# Patient Record
Sex: Male | Born: 1955 | Race: White | Hispanic: No | Marital: Single | Smoking: Never smoker
Health system: Southern US, Community
[De-identification: ages and names within clinical notes are randomized; demographics above are authoritative.]

## PROBLEM LIST (undated history)

## (undated) DIAGNOSIS — F909 Attention-deficit hyperactivity disorder, unspecified type: Secondary | ICD-10-CM

## (undated) DIAGNOSIS — K4020 Bilateral inguinal hernia, without obstruction or gangrene, not specified as recurrent: Secondary | ICD-10-CM

## (undated) DIAGNOSIS — F419 Anxiety disorder, unspecified: Secondary | ICD-10-CM

## (undated) DIAGNOSIS — M1990 Unspecified osteoarthritis, unspecified site: Secondary | ICD-10-CM

## (undated) DIAGNOSIS — M109 Gout, unspecified: Secondary | ICD-10-CM

## (undated) DIAGNOSIS — Z973 Presence of spectacles and contact lenses: Secondary | ICD-10-CM

## (undated) DIAGNOSIS — K219 Gastro-esophageal reflux disease without esophagitis: Secondary | ICD-10-CM

## (undated) DIAGNOSIS — J986 Disorders of diaphragm: Secondary | ICD-10-CM

## (undated) HISTORY — PX: COLONOSCOPY: SHX174

## (undated) HISTORY — PX: TONSILLECTOMY: SUR1361

## (undated) HISTORY — PX: BILATERAL CARPAL TUNNEL RELEASE: SHX6508

## (undated) HISTORY — PX: HERNIA REPAIR: SHX51

## (undated) HISTORY — PX: MULTIPLE TOOTH EXTRACTIONS: SHX2053

## (undated) HISTORY — PX: JOINT REPLACEMENT: SHX530

## (undated) HISTORY — PX: LEG SURGERY: SHX1003

---

## 2015-12-26 ENCOUNTER — Inpatient Hospital Stay (HOSPITAL_COMMUNITY): Admission: RE | Admit: 2015-12-26 | Payer: Self-pay | Source: Ambulatory Visit

## 2015-12-26 NOTE — Pre-Procedure Instructions (Signed)
    Chad Roth  12/26/2015      [id number] [place] [place] [address] [place] [address] Phone: [phone] Fax: [phone]    Your procedure is scheduled on Wednesday, Jan 04, 2016  Report to [facility] Admitting at 10:30 A.M.  Call this number if you have problems the morning of surgery:  [phone]   Remember:  Do not eat food or drink liquids after midnight Tuesday, Jan 03, 2016  Take these medicines the morning of surgery with A SIP OF WATER :allopurinol (ZYLOPRIM),   omeprazole (PRILOSEC) Stop taking Aspirin, vitamins, fish oil, and herbal medications. Do not take any NSAIDs ie: Ibuprofen, Advil, Naproxen, BC and Goody Powder or any medication containing Aspirin;stop now.  Do not wear jewelry, make-up or nail polish.  Do not wear lotions, powders, or perfumes.  You may not wear deodorant.  Do not shave 48 hours prior to surgery.  Men may shave face and neck.  Do not bring valuables to the hospital.  [facility] is not responsible for any belongings or valuables.  Contacts, dentures or bridgework may not be worn into surgery.  Leave your suitcase in the car.  After surgery it may be brought to your room.  For patients admitted to the hospital, discharge time will be determined by your treatment team.  Patients discharged the day of surgery will not be allowed to drive home.   Name and phone number of your driver:   Special instructions: Shower the night before surgery and the morning of surgery with CHG.  Please read over the following fact sheets that you were given. Pain Booklet, Coughing and Deep Breathing, Total Joint Packet, MRSA Information and Surgical Site Infection Prevention

## 2015-12-27 ENCOUNTER — Other Ambulatory Visit (HOSPITAL_COMMUNITY): Payer: Self-pay

## 2015-12-29 ENCOUNTER — Encounter (HOSPITAL_COMMUNITY): Payer: Self-pay

## 2015-12-29 ENCOUNTER — Ambulatory Visit (HOSPITAL_COMMUNITY)
Admission: RE | Admit: 2015-12-29 | Discharge: 2015-12-29 | Disposition: A | Discharge status: Home / Self Care | Payer: Medicaid Other | Source: Ambulatory Visit | Attending: Surgery | Admitting: Surgery

## 2015-12-29 ENCOUNTER — Encounter (HOSPITAL_COMMUNITY)
Admission: RE | Admit: 2015-12-29 | Discharge: 2015-12-29 | Disposition: A | Discharge status: Home / Self Care | Payer: Medicaid Other | Source: Ambulatory Visit | Attending: Orthopaedic Surgery | Admitting: Orthopaedic Surgery

## 2015-12-29 DIAGNOSIS — Z01812 Encounter for preprocedural laboratory examination: Secondary | ICD-10-CM | POA: Diagnosis not present

## 2015-12-29 DIAGNOSIS — Z01818 Encounter for other preprocedural examination: Secondary | ICD-10-CM | POA: Insufficient documentation

## 2015-12-29 HISTORY — DX: Unspecified osteoarthritis, unspecified site: M19.90

## 2015-12-29 HISTORY — DX: Gastro-esophageal reflux disease without esophagitis: K21.9

## 2015-12-29 LAB — CBC
HCT: 47.3 % (ref 39.0–52.0)
Hemoglobin: 15.7 g/dL (ref 13.0–17.0)
MCH: 30.6 pg (ref 26.0–34.0)
MCHC: 33.2 g/dL (ref 30.0–36.0)
MCV: 92.2 fL (ref 78.0–100.0)
PLATELETS: 263 10*3/uL (ref 150–400)
RBC: 5.13 MIL/uL (ref 4.22–5.81)
RDW: 13 % (ref 11.5–15.5)
WBC: 5.1 10*3/uL (ref 4.0–10.5)

## 2015-12-29 LAB — COMPREHENSIVE METABOLIC PANEL
ALT: 18 U/L (ref 17–63)
ANION GAP: 9 (ref 5–15)
AST: 24 U/L (ref 15–41)
Albumin: 3.9 g/dL (ref 3.5–5.0)
Alkaline Phosphatase: 94 U/L (ref 38–126)
BUN: 14 mg/dL (ref 6–20)
CHLORIDE: 104 mmol/L (ref 101–111)
CO2: 28 mmol/L (ref 22–32)
Calcium: 9.4 mg/dL (ref 8.9–10.3)
Creatinine, Ser: 1.13 mg/dL (ref 0.61–1.24)
Glucose, Bld: 85 mg/dL (ref 65–99)
POTASSIUM: 4.3 mmol/L (ref 3.5–5.1)
Sodium: 141 mmol/L (ref 135–145)
TOTAL PROTEIN: 6.7 g/dL (ref 6.5–8.1)
Total Bilirubin: 0.8 mg/dL (ref 0.3–1.2)

## 2015-12-29 LAB — URINALYSIS, ROUTINE W REFLEX MICROSCOPIC
BILIRUBIN URINE: NEGATIVE
Glucose, UA: NEGATIVE mg/dL
Hgb urine dipstick: NEGATIVE
KETONES UR: NEGATIVE mg/dL
LEUKOCYTES UA: NEGATIVE
NITRITE: NEGATIVE
PROTEIN: NEGATIVE mg/dL
Specific Gravity, Urine: 1.021 (ref 1.005–1.030)
pH: 6 (ref 5.0–8.0)

## 2015-12-29 LAB — SURGICAL PCR SCREEN
MRSA, PCR: NEGATIVE
STAPHYLOCOCCUS AUREUS: NEGATIVE

## 2015-12-29 LAB — APTT: aPTT: 32 seconds (ref 24–37)

## 2015-12-29 LAB — PROTIME-INR
INR: 1.04 (ref 0.00–1.49)
PROTHROMBIN TIME: 13.8 s (ref 11.6–15.2)

## 2016-01-04 ENCOUNTER — Inpatient Hospital Stay (HOSPITAL_COMMUNITY)
Admission: RE | Admit: 2016-01-04 | Discharge: 2016-01-07 | DRG: 470 | Disposition: A | Discharge status: Home / Self Care | Payer: Medicaid Other | Source: Ambulatory Visit | Attending: Orthopaedic Surgery | Admitting: Orthopaedic Surgery

## 2016-01-04 ENCOUNTER — Inpatient Hospital Stay (HOSPITAL_COMMUNITY): Payer: Medicaid Other | Admitting: Anesthesiology

## 2016-01-04 ENCOUNTER — Inpatient Hospital Stay (HOSPITAL_COMMUNITY): Payer: Medicaid Other

## 2016-01-04 ENCOUNTER — Encounter (HOSPITAL_COMMUNITY)
Admission: RE | Disposition: A | Discharge status: Home / Self Care | Payer: Self-pay | Source: Ambulatory Visit | Attending: Orthopaedic Surgery

## 2016-01-04 ENCOUNTER — Encounter (HOSPITAL_COMMUNITY): Payer: Self-pay | Admitting: *Deleted

## 2016-01-04 DIAGNOSIS — M1711 Unilateral primary osteoarthritis, right knee: Principal | ICD-10-CM | POA: Diagnosis present

## 2016-01-04 DIAGNOSIS — K219 Gastro-esophageal reflux disease without esophagitis: Secondary | ICD-10-CM | POA: Diagnosis present

## 2016-01-04 DIAGNOSIS — Z79899 Other long term (current) drug therapy: Secondary | ICD-10-CM | POA: Diagnosis not present

## 2016-01-04 DIAGNOSIS — Z96659 Presence of unspecified artificial knee joint: Secondary | ICD-10-CM

## 2016-01-04 DIAGNOSIS — Z09 Encounter for follow-up examination after completed treatment for conditions other than malignant neoplasm: Secondary | ICD-10-CM

## 2016-01-04 DIAGNOSIS — Z96651 Presence of right artificial knee joint: Secondary | ICD-10-CM

## 2016-01-04 DIAGNOSIS — M25561 Pain in right knee: Secondary | ICD-10-CM | POA: Diagnosis present

## 2016-01-04 HISTORY — PX: KNEE ARTHROPLASTY: SHX992

## 2016-01-04 SURGERY — ARTHROPLASTY, KNEE, TOTAL, USING IMAGELESS COMPUTER-ASSISTED NAVIGATION
Anesthesia: Spinal | Laterality: Right

## 2016-01-04 MED ORDER — PANTOPRAZOLE SODIUM 40 MG PO TBEC
40.0000 mg | DELAYED_RELEASE_TABLET | Freq: Every day | ORAL | Status: DC
  Administered 2016-01-05 – 2016-01-07 (×3): 40 mg via ORAL
  Filled 2016-01-04 (×4): qty 1

## 2016-01-04 MED ORDER — MEPERIDINE HCL 25 MG/ML IJ SOLN: 6.2500 mg | INTRAMUSCULAR | Status: DC | PRN

## 2016-01-04 MED ORDER — LIDOCAINE 2% (20 MG/ML) 5 ML SYRINGE
INTRAMUSCULAR | Status: AC
  Filled 2016-01-04: qty 5

## 2016-01-04 MED ORDER — HYDROMORPHONE HCL 1 MG/ML IJ SOLN: 0.2500 mg | INTRAMUSCULAR | Status: DC | PRN

## 2016-01-04 MED ORDER — PROPOFOL 500 MG/50ML IV EMUL
INTRAVENOUS | Status: DC | PRN
  Administered 2016-01-04: 50 ug/kg/min via INTRAVENOUS

## 2016-01-04 MED ORDER — ASPIRIN EC 325 MG PO TBEC
325.0000 mg | DELAYED_RELEASE_TABLET | Freq: Every day | ORAL | Status: DC
  Administered 2016-01-05 – 2016-01-07 (×3): 325 mg via ORAL
  Filled 2016-01-04 (×3): qty 1

## 2016-01-04 MED ORDER — PHENYLEPHRINE HCL 10 MG/ML IJ SOLN
INTRAMUSCULAR | Status: DC | PRN
  Administered 2016-01-04: 80 ug via INTRAVENOUS

## 2016-01-04 MED ORDER — CEFAZOLIN SODIUM-DEXTROSE 2-4 GM/100ML-% IV SOLN
2.0000 g | INTRAVENOUS | Status: AC
  Administered 2016-01-04: 2 g via INTRAVENOUS

## 2016-01-04 MED ORDER — ACETAMINOPHEN 650 MG RE SUPP: 650.0000 mg | Freq: Four times a day (QID) | RECTAL | Status: DC | PRN

## 2016-01-04 MED ORDER — PROPOFOL 10 MG/ML IV BOLUS
INTRAVENOUS | Status: DC | PRN
  Administered 2016-01-04: 20 mg via INTRAVENOUS

## 2016-01-04 MED ORDER — BUPIVACAINE IN DEXTROSE 0.75-8.25 % IT SOLN
INTRATHECAL | Status: DC | PRN
  Administered 2016-01-04: 2 mL via INTRATHECAL

## 2016-01-04 MED ORDER — FENTANYL CITRATE (PF) 250 MCG/5ML IJ SOLN
INTRAMUSCULAR | Status: AC
  Filled 2016-01-04: qty 5

## 2016-01-04 MED ORDER — ALLOPURINOL 300 MG PO TABS
300.0000 mg | ORAL_TABLET | Freq: Every day | ORAL | Status: DC
  Administered 2016-01-05 – 2016-01-07 (×3): 300 mg via ORAL
  Filled 2016-01-04 (×4): qty 1

## 2016-01-04 MED ORDER — CEFAZOLIN SODIUM-DEXTROSE 2-4 GM/100ML-% IV SOLN
INTRAVENOUS | Status: AC
  Filled 2016-01-04: qty 100

## 2016-01-04 MED ORDER — METOCLOPRAMIDE HCL 5 MG PO TABS: 5.0000 mg | ORAL_TABLET | Freq: Three times a day (TID) | ORAL | Status: DC | PRN

## 2016-01-04 MED ORDER — BUPIVACAINE LIPOSOME 1.3 % IJ SUSP
20.0000 mL | INTRAMUSCULAR | Status: DC
  Filled 2016-01-04: qty 20

## 2016-01-04 MED ORDER — ACETAMINOPHEN 325 MG PO TABS: 650.0000 mg | ORAL_TABLET | Freq: Four times a day (QID) | ORAL | Status: DC | PRN

## 2016-01-04 MED ORDER — OXYCODONE HCL 5 MG PO TABS
5.0000 mg | ORAL_TABLET | ORAL | Status: DC | PRN
  Administered 2016-01-04 – 2016-01-07 (×13): 10 mg via ORAL
  Filled 2016-01-04 (×13): qty 2

## 2016-01-04 MED ORDER — PROPOFOL 10 MG/ML IV BOLUS
INTRAVENOUS | Status: AC
  Filled 2016-01-04: qty 20

## 2016-01-04 MED ORDER — METHOCARBAMOL 1000 MG/10ML IJ SOLN: 500.0000 mg | Freq: Four times a day (QID) | INTRAVENOUS | Status: DC | PRN

## 2016-01-04 MED ORDER — FENTANYL CITRATE (PF) 100 MCG/2ML IJ SOLN
INTRAMUSCULAR | Status: AC
  Filled 2016-01-04: qty 2

## 2016-01-04 MED ORDER — SODIUM CHLORIDE 0.45 % IV SOLN
INTRAVENOUS | Status: DC
  Administered 2016-01-04 – 2016-01-05 (×3): via INTRAVENOUS

## 2016-01-04 MED ORDER — MENTHOL 3 MG MT LOZG: 1.0000 | LOZENGE | OROMUCOSAL | Status: DC | PRN

## 2016-01-04 MED ORDER — MIDAZOLAM HCL 5 MG/5ML IJ SOLN
INTRAMUSCULAR | Status: DC | PRN
  Administered 2016-01-04: 2 mg via INTRAVENOUS

## 2016-01-04 MED ORDER — POLYETHYLENE GLYCOL 3350 17 G PO PACK: 17.0000 g | PACK | Freq: Every day | ORAL | Status: DC | PRN

## 2016-01-04 MED ORDER — ONDANSETRON HCL 4 MG PO TABS: 4.0000 mg | ORAL_TABLET | Freq: Four times a day (QID) | ORAL | Status: DC | PRN

## 2016-01-04 MED ORDER — ONDANSETRON HCL 4 MG/2ML IJ SOLN
INTRAMUSCULAR | Status: AC
  Filled 2016-01-04: qty 2

## 2016-01-04 MED ORDER — LACTATED RINGERS IV SOLN: INTRAVENOUS | Status: DC

## 2016-01-04 MED ORDER — SODIUM CHLORIDE 0.9 % IR SOLN
Status: DC | PRN
  Administered 2016-01-04: 1000 mL

## 2016-01-04 MED ORDER — MIDAZOLAM HCL 2 MG/2ML IJ SOLN
INTRAMUSCULAR | Status: AC
  Filled 2016-01-04: qty 2

## 2016-01-04 MED ORDER — LIDOCAINE HCL (CARDIAC) 20 MG/ML IV SOLN
INTRAVENOUS | Status: DC | PRN
  Administered 2016-01-04: 50 mg via INTRATRACHEAL

## 2016-01-04 MED ORDER — DOCUSATE SODIUM 100 MG PO CAPS
100.0000 mg | ORAL_CAPSULE | Freq: Two times a day (BID) | ORAL | Status: DC
  Administered 2016-01-04 – 2016-01-07 (×6): 100 mg via ORAL
  Filled 2016-01-04 (×6): qty 1

## 2016-01-04 MED ORDER — BUPIVACAINE LIPOSOME 1.3 % IJ SUSP
INTRAMUSCULAR | Status: DC | PRN
  Administered 2016-01-04: 40 mL

## 2016-01-04 MED ORDER — LACTATED RINGERS IV SOLN
INTRAVENOUS | Status: DC
  Administered 2016-01-04 (×2): via INTRAVENOUS

## 2016-01-04 MED ORDER — PHENOL 1.4 % MT LIQD
1.0000 | OROMUCOSAL | Status: DC | PRN
Start: 2016-01-04 — End: 2016-01-07

## 2016-01-04 MED ORDER — ONDANSETRON HCL 4 MG/2ML IJ SOLN: 4.0000 mg | Freq: Four times a day (QID) | INTRAMUSCULAR | Status: DC | PRN

## 2016-01-04 MED ORDER — METOCLOPRAMIDE HCL 5 MG/ML IJ SOLN: 5.0000 mg | Freq: Three times a day (TID) | INTRAMUSCULAR | Status: DC | PRN

## 2016-01-04 MED ORDER — CEFAZOLIN SODIUM 1-5 GM-% IV SOLN
1.0000 g | Freq: Three times a day (TID) | INTRAVENOUS | Status: AC
  Administered 2016-01-04 – 2016-01-05 (×2): 1 g via INTRAVENOUS
  Filled 2016-01-04 (×3): qty 50

## 2016-01-04 MED ORDER — FENTANYL CITRATE (PF) 250 MCG/5ML IJ SOLN
INTRAMUSCULAR | Status: DC | PRN
  Administered 2016-01-04: 50 ug via INTRAVENOUS

## 2016-01-04 MED ORDER — HYDROMORPHONE HCL 1 MG/ML IJ SOLN
1.0000 mg | INTRAMUSCULAR | Status: DC | PRN
  Administered 2016-01-04 – 2016-01-05 (×4): 1 mg via INTRAVENOUS
  Filled 2016-01-04 (×4): qty 1

## 2016-01-04 MED ORDER — METHOCARBAMOL 500 MG PO TABS
500.0000 mg | ORAL_TABLET | Freq: Four times a day (QID) | ORAL | Status: DC | PRN
Start: 2016-01-04 — End: 2016-01-07
  Administered 2016-01-04 – 2016-01-07 (×8): 500 mg via ORAL
  Filled 2016-01-04 (×8): qty 1

## 2016-01-04 MED ORDER — CHLORHEXIDINE GLUCONATE 4 % EX LIQD
60.0000 mL | Freq: Once | CUTANEOUS | Status: DC
Start: 2016-01-04 — End: 2016-01-04

## 2016-01-04 SURGICAL SUPPLY — 65 items
BANDAGE ACE 4X5 VEL STRL LF (GAUZE/BANDAGES/DRESSINGS) ×2 IMPLANT
BANDAGE ACE 6X5 VEL STRL LF (GAUZE/BANDAGES/DRESSINGS) ×2 IMPLANT
BANDAGE ELASTIC 4 VELCRO ST LF (GAUZE/BANDAGES/DRESSINGS) ×2 IMPLANT
BANDAGE ESMARK 6X9 LF (GAUZE/BANDAGES/DRESSINGS) ×1 IMPLANT
BENZOIN TINCTURE PRP APPL 2/3 (GAUZE/BANDAGES/DRESSINGS) ×2 IMPLANT
BLADE SAGITTAL 25.0X1.19X90 (BLADE) ×2 IMPLANT
BLADE SAW SGTL 13X75X1.27 (BLADE) ×2 IMPLANT
BNDG ELASTIC 6X10 VLCR STRL LF (GAUZE/BANDAGES/DRESSINGS) ×2 IMPLANT
BNDG ESMARK 6X9 LF (GAUZE/BANDAGES/DRESSINGS) ×2
BOWL SMART MIX CTS (DISPOSABLE) ×2 IMPLANT
CAP KNEE TOTAL 3 SIGMA ×2 IMPLANT
CEMENT HV SMART SET (Cement) ×4 IMPLANT
CLSR STERI-STRIP ANTIMIC 1/2X4 (GAUZE/BANDAGES/DRESSINGS) ×4 IMPLANT
COVER SURGICAL LIGHT HANDLE (MISCELLANEOUS) ×2 IMPLANT
CUFF TOURNIQUET SINGLE 34IN LL (TOURNIQUET CUFF) ×2 IMPLANT
CUFF TOURNIQUET SINGLE 44IN (TOURNIQUET CUFF) IMPLANT
DRAPE ORTHO SPLIT 77X108 STRL (DRAPES) ×2
DRAPE SURG ORHT 6 SPLT 77X108 (DRAPES) ×2 IMPLANT
DRAPE U-SHAPE 47X51 STRL (DRAPES) ×2 IMPLANT
DRSG ADAPTIC 3X8 NADH LF (GAUZE/BANDAGES/DRESSINGS) ×2 IMPLANT
DRSG PAD ABDOMINAL 8X10 ST (GAUZE/BANDAGES/DRESSINGS) ×2 IMPLANT
DURAPREP 26ML APPLICATOR (WOUND CARE) ×2 IMPLANT
ELECT REM PT RETURN 9FT ADLT (ELECTROSURGICAL) ×2
ELECTRODE REM PT RTRN 9FT ADLT (ELECTROSURGICAL) ×1 IMPLANT
FACESHIELD WRAPAROUND (MASK) ×4 IMPLANT
GAUZE SPONGE 4X4 12PLY STRL (GAUZE/BANDAGES/DRESSINGS) ×4 IMPLANT
GAUZE XEROFORM 5X9 LF (GAUZE/BANDAGES/DRESSINGS) ×2 IMPLANT
GLOVE BIOGEL PI IND STRL 8 (GLOVE) ×2 IMPLANT
GLOVE BIOGEL PI INDICATOR 8 (GLOVE) ×2
GLOVE ORTHO TXT STRL SZ7.5 (GLOVE) ×4 IMPLANT
GOWN STRL REUS W/ TWL LRG LVL3 (GOWN DISPOSABLE) ×1 IMPLANT
GOWN STRL REUS W/ TWL XL LVL3 (GOWN DISPOSABLE) ×1 IMPLANT
GOWN STRL REUS W/TWL 2XL LVL3 (GOWN DISPOSABLE) ×2 IMPLANT
GOWN STRL REUS W/TWL LRG LVL3 (GOWN DISPOSABLE) ×1
GOWN STRL REUS W/TWL XL LVL3 (GOWN DISPOSABLE) ×1
HANDPIECE INTERPULSE COAX TIP (DISPOSABLE) ×1
KIT BASIN OR (CUSTOM PROCEDURE TRAY) ×2 IMPLANT
KIT ROOM TURNOVER OR (KITS) ×2 IMPLANT
MANIFOLD NEPTUNE II (INSTRUMENTS) ×2 IMPLANT
MARKER SPHERE PSV REFLC THRD 5 (MARKER) ×8 IMPLANT
NEEDLE HYPO 25GX1X1/2 BEV (NEEDLE) ×2 IMPLANT
NS IRRIG 1000ML POUR BTL (IV SOLUTION) ×2 IMPLANT
PACK TOTAL JOINT (CUSTOM PROCEDURE TRAY) ×2 IMPLANT
PACK UNIVERSAL I (CUSTOM PROCEDURE TRAY) ×2 IMPLANT
PAD ARMBOARD 7.5X6 YLW CONV (MISCELLANEOUS) ×4 IMPLANT
PAD CAST 4YDX4 CTTN HI CHSV (CAST SUPPLIES) ×1 IMPLANT
PADDING CAST COTTON 4X4 STRL (CAST SUPPLIES) ×1
PADDING CAST COTTON 6X4 STRL (CAST SUPPLIES) ×2 IMPLANT
PIN SCHANZ 4MM 130MM (PIN) ×8 IMPLANT
SET HNDPC FAN SPRY TIP SCT (DISPOSABLE) ×1 IMPLANT
SPONGE GAUZE 4X4 12PLY STER LF (GAUZE/BANDAGES/DRESSINGS) ×2 IMPLANT
STAPLER VISISTAT 35W (STAPLE) IMPLANT
SUCTION FRAZIER HANDLE 10FR (MISCELLANEOUS) ×1
SUCTION TUBE FRAZIER 10FR DISP (MISCELLANEOUS) ×1 IMPLANT
SUT VIC AB 0 CT1 27 (SUTURE) ×2
SUT VIC AB 0 CT1 27XBRD ANBCTR (SUTURE) ×2 IMPLANT
SUT VIC AB 1 CTX 36 (SUTURE) ×3
SUT VIC AB 1 CTX36XBRD ANBCTR (SUTURE) ×3 IMPLANT
SUT VIC AB 2-0 CT1 27 (SUTURE) ×3
SUT VIC AB 2-0 CT1 TAPERPNT 27 (SUTURE) ×3 IMPLANT
SUT VIC AB 3-0 X1 27 (SUTURE) ×4 IMPLANT
SYR CONTROL 10ML LL (SYRINGE) ×2 IMPLANT
TOWEL OR 17X24 6PK STRL BLUE (TOWEL DISPOSABLE) ×2 IMPLANT
TOWEL OR 17X26 10 PK STRL BLUE (TOWEL DISPOSABLE) ×2 IMPLANT
WATER STERILE IRR 1000ML POUR (IV SOLUTION) ×2 IMPLANT

## 2016-01-04 NOTE — Anesthesia Preprocedure Evaluation (Addendum)
Anesthesia Evaluation  Patient identified by MRN, date of birth, ID band Patient awake    Reviewed: Allergy & Precautions, NPO status , Patient's Chart, lab work & pertinent test results  Airway Mallampati: I  TM Distance: >3 FB Neck ROM: Full    Dental  (+) Edentulous Upper, Edentulous Lower   Pulmonary neg pulmonary ROS,    breath sounds clear to auscultation       Cardiovascular negative cardio ROS   Rhythm:Regular Rate:Normal     Neuro/Psych negative neurological ROS  negative psych ROS   GI/Hepatic Neg liver ROS, GERD  Medicated,  Endo/Other  negative endocrine ROS  Renal/GU negative Renal ROS  negative genitourinary   Musculoskeletal  (+) Arthritis , Osteoarthritis,    Abdominal Normal abdominal exam  (+)   Peds negative pediatric ROS (+)  Hematology negative hematology ROS (+)   Anesthesia Other Findings   Reproductive/Obstetrics negative OB ROS                            Lab Results  Component Value Date   WBC 5.1 12/29/2015   HGB 15.7 12/29/2015   HCT 47.3 12/29/2015   MCV 92.2 12/29/2015   PLT 263 12/29/2015   Lab Results  Component Value Date   CREATININE 1.13 12/29/2015   BUN 14 12/29/2015   NA 141 12/29/2015   K 4.3 12/29/2015   CL 104 12/29/2015   CO2 28 12/29/2015   Lab Results  Component Value Date   INR 1.04 12/29/2015     Anesthesia Physical Anesthesia Plan  ASA: II  Anesthesia Plan: Spinal   Post-op Pain Management:    Induction: Intravenous  Airway Management Planned: Simple Face Mask  Additional Equipment:   Intra-op Plan:   Post-operative Plan:   Informed Consent: I have reviewed the patients History and Physical, chart, labs and discussed the procedure including the risks, benefits and alternatives for the proposed anesthesia with the patient or authorized representative who has indicated his/her understanding and acceptance.      Plan Discussed with: CRNA  Anesthesia Plan Comments:         Anesthesia Quick Evaluation

## 2016-01-04 NOTE — Brief Op Note (Signed)
01/04/2016  3:41 PM  PATIENT:  Mathis Bud  60 y.o. male  PRE-OPERATIVE DIAGNOSIS:  Osteoarthritis Right Knee  POST-OPERATIVE DIAGNOSIS:  Osteoarthritis Right Knee  PROCEDURE:  Procedure(s): COMPUTER ASSISTED TOTAL KNEE ARTHROPLASTY (Right)  SURGEON:  Surgeon(s) and Role:    * Marybelle Killings, MD - Primary  PHYSICIAN ASSISTANT: Benjiman Core pa-c    ANESTHESIA:   spinal  EBL:  Total I/O In: 1100 [I.V.:1100] Out: 50 [Blood:50]  BLOOD ADMINISTERED:none  DRAINS: none   LOCAL MEDICATIONS USED:  exparel SPECIMEN:  No Specimen  DISPOSITION OF SPECIMEN:  N/A  COUNTS:  YES  TOURNIQUET:   Total Tourniquet Time Documented: Thigh (Right) - 62 minutes Total: Thigh (Right) - 62 minutes   DICTATION: .Viviann Spare Dictation  PLAN OF CARE: Admit to inpatient   PATIENT DISPOSITION:  PACU - hemodynamically stable.

## 2016-01-04 NOTE — Interval H&P Note (Signed)
History and Physical Interval Note:  01/04/2016 12:48 PM  Chad Roth  has presented today for surgery, with the diagnosis of Osteoarthritis Right Knee  The various methods of treatment have been discussed with the patient and family. After consideration of risks, benefits and other options for treatment, the patient has consented to  Procedure(s): COMPUTER ASSISTED TOTAL KNEE ARTHROPLASTY (Right) as a surgical intervention .  The patient's history has been reviewed, patient examined, no change in status, stable for surgery.  I have reviewed the patient's chart and labs.  Questions were answered to the patient's satisfaction.     Brionna Romanek C

## 2016-01-04 NOTE — Anesthesia Procedure Notes (Addendum)
Spinal Patient location during procedure: OR Start time: 01/04/2016 1:03 PM End time: 01/04/2016 1:05 PM Staffing Anesthesiologist: Suella Broad D Performed by: anesthesiologist  Preanesthetic Checklist Completed: patient identified, site marked, surgical consent, pre-op evaluation, timeout performed, IV checked, risks and benefits discussed and monitors and equipment checked Spinal Block Patient position: sitting Prep: Betadine Patient monitoring: heart rate, continuous pulse ox, blood pressure and cardiac monitor Approach: midline Location: L4-5 Injection technique: single-shot Needle Needle type: Whitacre and Introducer  Needle gauge: 24 G Needle length: 9 cm Additional Notes Negative paresthesia. Negative blood return. Positive free-flowing CSF. Expiration date of kit checked and confirmed. Patient tolerated procedure well, without complications.    Procedure Name: MAC Date/Time: 01/04/2016 1:02 PM Performed by: Mariea Clonts Pre-anesthesia Checklist: Patient identified, Emergency Drugs available, Suction available, Patient being monitored and Timeout performed Patient Re-evaluated:Patient Re-evaluated prior to inductionOxygen Delivery Method: Nasal cannula Placement Confirmation: positive ETCO2 and breath sounds checked- equal and bilateral Dental Injury: Teeth and Oropharynx as per pre-operative assessment

## 2016-01-04 NOTE — Anesthesia Postprocedure Evaluation (Signed)
Anesthesia Post Note  Patient: Chad Roth  Procedure(s) Performed: Procedure(s) (LRB): COMPUTER ASSISTED TOTAL KNEE ARTHROPLASTY (Right)  Patient location during evaluation: PACU Anesthesia Type: MAC and Spinal Level of consciousness: awake, awake and alert and oriented Pain management: pain level controlled Vital Signs Assessment: post-procedure vital signs reviewed and stable Respiratory status: spontaneous breathing, nonlabored ventilation and respiratory function stable Cardiovascular status: blood pressure returned to baseline Postop Assessment: spinal receding Anesthetic complications: no    Last Vitals:  Filed Vitals:   01/04/16 1634 01/04/16 1649  BP: 82/69 110/85  Pulse: 56 64  Temp:    Resp: 19 18    Last Pain: There were no vitals filed for this visit.               Kikue Gerhart,Sonu COKER

## 2016-01-04 NOTE — Op Note (Signed)
NAMEJAAFAR, Chad Roth NO.:  [id number]  MEDICAL RECORD NO.:  [id number]  LOCATION:  [facility]                         FACILITY:  [facility]  PHYSICIAN:  Gaynor Ferreras C. Lorin Mercy, M.D.    DATE OF BIRTH:  August 30, 1956  DATE OF PROCEDURE:  01/04/2016 DATE OF DISCHARGE:                              OPERATIVE REPORT   PREOPERATIVE DIAGNOSIS:  Right knee osteoarthritis.  POSTOPERATIVE DIAGNOSIS:  Right knee osteoarthritis.  PROCEDURE:  Right total knee arthroplasty computer assist.  SURGEON:  Letasha Kershaw C. Lorin Mercy, M.D.  ASSISTANT:  Alyson Locket. Ricard Dillon, PA-C, medically necessary and present for the entire procedure.  ANESTHESIA:  Spinal plus Marcaine and Exparel.  TOURNIQUET TIME:  1 hour and 10 minutes x350.  DESCRIPTION OF PROCEDURE:  After standard prepping and draping, proximal thigh tourniquet, usual impervious stockinette, Coban, sterile skin marker, Betadine, Steri-Drape, split sheets and drapes, DuraPrep have been used, preoperative Ancef prophylaxis.  Midline incision was made after wrapping the leg with an Esmarch.  Patella was everted, 10 mm were resected from the patellar facet and medial parapatellar incision had been made.  Spurs were removed.  There was extremely chronic hypertrophic synovitis changes consistent with gouty arthropathy with crystals intermixed with balls of fibrous tissue.  These were resected, synovectomy was performed.  Computer pins were placed at the incision of the tibia.  The patient had intramedullary nail with proximal and distal interlocks and the two pins were placed in the femur since the intramedullary alignment was not able to be used on the femoral side.  A 9 mm was resected on the femur, #5 femur, #4 tibia, 8-mm resected on the tibia, the medial side was still tight by 3 mm even after some stripping of the deep medial collateral ligament.  Additional 4 mm were taken on the tibia which allowed full extension.  There was good balance in collateral  ligaments.  ACL and PCL have been completely resected. Trials were removed.  Pulse lavage.  Vacuum mixing the cement.  Tibia was cemented first followed by femur.  A 10-mm tibial tray #5 and 38-mm 3-PEG patella.  The patient had everything held in full extension until the cement was hard at 15 minutes. Tourniquet was deflated, hemostasis,  Exparel and Marcaine infiltration, then standard layered closure.  Instrument count and needle count was correct.  The patient was transferred to the recovery room.     [place] C. Lorin Mercy, M.D.     MCY/MEDQ  D:  01/04/2016  T:  01/04/2016  Job:  [id number]

## 2016-01-04 NOTE — Transfer of Care (Signed)
Immediate Anesthesia Transfer of Care Note  Patient: Chad Roth  Procedure(s) Performed: Procedure(s): COMPUTER ASSISTED TOTAL KNEE ARTHROPLASTY (Right)  Patient Location: PACU  Anesthesia Type:MAC and Spinal  Level of Consciousness: awake, alert , oriented and patient cooperative  Airway & Oxygen Therapy: Patient Spontanous Breathing  Post-op Assessment: Report given to RN and Post -op Vital signs reviewed and stable  Post vital signs: Reviewed and stable  Last Vitals:  Filed Vitals:   01/04/16 1045  BP: 155/92  Pulse: 72  Temp: 36.4 C  Resp: 20    Last Pain: There were no vitals filed for this visit.       Complications: No apparent anesthesia complications

## 2016-01-04 NOTE — Progress Notes (Signed)
Orthopedic Tech Progress Note Patient Details:  Chad Roth 22-Apr-1956 [id number] Applied CPM to RLE. CPM Right Knee CPM Right Knee: On Right Knee Flexion (Degrees): 90 Right Knee Extension (Degrees): 0   [name] 01/04/2016, 4:53 PM

## 2016-01-04 NOTE — H&P (Signed)
TOTAL KNEE ADMISSION H&P  Patient is being admitted for right total knee arthroplasty.  Subjective:  Chief Complaint:right knee pain.  HPI: Chad Roth, 60 y.o. male, has a history of pain and functional disability in the right knee due to arthritis and has failed non-surgical conservative treatments for greater than 12 weeks to includeNSAID's and/or analgesics, corticosteriod injections, use of assistive devices, weight reduction as appropriate and activity modification.  Onset of symptoms was gradual, starting 10 years ago with gradually worsening course since that time.  Patient currently rates pain in the right knee(s) at 10 out of 10 with activity. Patient has worsening of pain with activity and weight bearing, pain that interferes with activities of daily living, pain with passive range of motion and joint swelling.  Patient has evidence of subchondral sclerosis, periarticular osteophytes and joint space narrowing by imaging studies. There is no active infection.  There are no active problems to display for this patient.  Past Medical History  Diagnosis Date  . GERD (gastroesophageal reflux disease)   . Arthritis     Past Surgical History  Procedure Laterality Date  . Leg surgery      RIGHT  . Bilateral carpal tunnel release      Prescriptions prior to admission  Medication Sig Dispense Refill Last Dose  . allopurinol (ZYLOPRIM) 300 MG tablet Take 300 mg by mouth daily.   01/03/2016 at 2200  . naproxen sodium (ANAPROX) 220 MG tablet Take 220-440 mg by mouth 3 (three) times daily as needed (knee pain).   Past Week at Unknown time  . omeprazole (PRILOSEC) 40 MG capsule Take 40 mg by mouth daily.   01/03/2016 at 2200   No Known Allergies  Social History  Substance Use Topics  . Smoking status: Never Smoker   . Smokeless tobacco: Never Used  . Alcohol Use: Yes     Comment: BEER   OCC    History reviewed. No pertinent family history.   Review of Systems  Constitutional: Negative.    HENT: Negative.   Eyes: Negative.   Respiratory: Negative.   Cardiovascular: Negative.   Gastrointestinal: Negative.   Genitourinary: Negative.   Musculoskeletal: Positive for joint pain.  Skin: Negative.   Neurological: Negative.   Psychiatric/Behavioral: Negative.     Objective:  Physical Exam  Constitutional: He is oriented to person, place, and time. No distress.  HENT:  Head: Atraumatic.  Eyes: EOM are normal.  Neck: Normal range of motion.  Cardiovascular: Normal rate.   Respiratory: No respiratory distress.  GI: He exhibits no distension.  Musculoskeletal: He exhibits tenderness.  Neurological: He is alert and oriented to person, place, and time.  Skin: Skin is warm and dry.  Psychiatric: He has a normal mood and affect.    Vital signs in last 24 hours: Temp:  [97.6 F (36.4 C)] 97.6 F (36.4 C) (05/03 1045) Pulse Rate:  [72] 72 (05/03 1045) Resp:  [20] 20 (05/03 1045) BP: (155)/(92) 155/92 mmHg (05/03 1045) SpO2:  [98 %] 98 % (05/03 1045) Weight:  [82.555 kg (182 lb)-82.6 kg (182 lb 1.6 oz)] 82.555 kg (182 lb) (05/03 1045)  Labs:   Estimated body mass index is 25.4 kg/(m^2) as calculated from the following:   Height as of this encounter: 5\' 11"  (1.803 m).   Weight as of this encounter: 82.555 kg (182 lb).   Imaging Review Plain radiographs demonstrate moderate degenerative joint disease of the right knee(s). The overall alignment ismild varus. The bone quality appears to be  good for age and reported activity level.  Assessment/Plan:  End stage arthritis, right knee   The patient history, physical examination, clinical judgment of the provider and imaging studies are consistent with end stage degenerative joint disease of the right knee(s) and total knee arthroplasty is deemed medically necessary. The treatment options including medical management, injection therapy arthroscopy and arthroplasty were discussed at length. The risks and benefits of total  knee arthroplasty were presented and reviewed. The risks due to aseptic loosening, infection, stiffness, patella tracking problems, thromboembolic complications and other imponderables were discussed. The patient acknowledged the explanation, agreed to proceed with the plan and consent was signed. Patient is being admitted for inpatient treatment for surgery, pain control, PT, OT, prophylactic antibiotics, VTE prophylaxis, progressive ambulation and ADL's and discharge planning. The patient is planning to be discharged home with home health services

## 2016-01-05 ENCOUNTER — Encounter (HOSPITAL_COMMUNITY): Payer: Self-pay | Admitting: Orthopaedic Surgery

## 2016-01-05 LAB — BASIC METABOLIC PANEL
Anion gap: 8 (ref 5–15)
BUN: 10 mg/dL (ref 6–20)
CALCIUM: 8.3 mg/dL — AB (ref 8.9–10.3)
CHLORIDE: 101 mmol/L (ref 101–111)
CO2: 26 mmol/L (ref 22–32)
CREATININE: 1.08 mg/dL (ref 0.61–1.24)
GFR calc Af Amer: >60 mL/min (ref >60)
GFR calc non Af Amer: >60 mL/min (ref >60)
Glucose, Bld: 124 mg/dL — ABNORMAL HIGH (ref 65–99)
Potassium: 3.7 mmol/L (ref 3.5–5.1)
SODIUM: 135 mmol/L (ref 135–145)

## 2016-01-05 LAB — CBC
HCT: 36.1 % — ABNORMAL LOW (ref 39.0–52.0)
HEMOGLOBIN: 11.7 g/dL — AB (ref 13.0–17.0)
MCH: 30.4 pg (ref 26.0–34.0)
MCHC: 32.4 g/dL (ref 30.0–36.0)
MCV: 93.8 fL (ref 78.0–100.0)
Platelets: 191 10*3/uL (ref 150–400)
RBC: 3.85 MIL/uL — ABNORMAL LOW (ref 4.22–5.81)
RDW: 12.9 % (ref 11.5–15.5)
WBC: 6.6 10*3/uL (ref 4.0–10.5)

## 2016-01-05 NOTE — Progress Notes (Signed)
Orthopedic Tech Progress Note Patient Details:  Chad Roth December 30, 1955 [id number] Ortho visit put on cpm at 1905 Patient ID: Chad Roth, male   DOB: October 13, 1955, 60 y.o.   MRN: [id number]   [name] 01/05/2016, 7:07 PM

## 2016-01-05 NOTE — Progress Notes (Signed)
Occupational Therapy Evaluation Patient Details Name: Chad Roth MRN: [id number] DOB: 02-Jun-1956 Today's Date: 01/05/2016    History of Present Illness Pt is a 60 y.o. male now s/p Rt TKA. PMH: femur fx.    Clinical Impression   PTA, pt mod I with ADL and mobility @ RW level due to R knee pain. Required S this pm with transfers and min A with mobility @ RW level. Pt would like to go home if possible. If pt progresses, feel D/C home may be an option if he can stay on the first floor of his home. Will need to be mod I with ADL and functional mobility to safely D/C home. Will follow acutely to facilitate safe D/C to next venue of care.     Follow Up Recommendations  SNF (pending progress)    Equipment Recommendations  3 in 1 bedside comode    Recommendations for Other Services       Precautions / Restrictions Precautions Precautions: Fall;Knee Precaution Booklet Issued: Yes (comment) Precaution Comments: HEP provided, reviewed knee extension precautions Required Braces or Orthoses: Knee Immobilizer - Right Knee Immobilizer - Right:  (off for CPM and PT) Restrictions Weight Bearing Restrictions: Yes RLE Weight Bearing: Weight bearing as tolerated      Mobility Bed Mobility               General bed mobility comments: pt up in chair upon arrival  Transfers Overall transfer level: Needs assistance Equipment used: Rolling walker (2 wheeled) Transfers: Sit to/from Stand Sit to Stand: Supervision         General transfer comment: good carry over from earlier PT session    Balance Overall balance assessment: Needs assistance Sitting-balance support: No upper extremity supported;Feet supported Sitting balance-Leahy Scale: Good     Standing balance support: Bilateral upper extremity supported Standing balance-Leahy Scale: Fair Standing balance comment: using rw for support                            ADL Overall ADL's : Needs assistance/impaired      Grooming: Set up;Sitting   Upper Body Bathing: Set up;Sitting   Lower Body Bathing: Minimal assistance;Sit to/from stand   Upper Body Dressing : Set up;Sitting   Lower Body Dressing: Moderate assistance;Sit to/from stand   Toilet Transfer: Minimal assistance;RW;Ambulation   Toileting- Water quality scientist and Hygiene: Min guard;Sit to/from stand       Functional mobility during ADLs: Minimal assistance;Rolling walker;Cueing for safety General ADL Comments: Began educating pt on use of AE for LB ADL. Feel pt could reach mod I level with AE. Concerned if pt able to stay downstairs as bed/bath upstairs.      Vision     Perception     Praxis      Pertinent Vitals/Pain Pain Assessment: Faces Pain Score: 3  Faces Pain Scale: Hurts a little bit Pain Location: R knee Pain Descriptors / Indicators: Aching Pain Intervention(s): Limited activity within patient's tolerance;Repositioned;Ice applied     Hand Dominance     Extremity/Trunk Assessment Upper Extremity Assessment Upper Extremity Assessment: Overall WFL for tasks assessed   Lower Extremity Assessment Lower Extremity Assessment: Defer to PT evaluation RLE Deficits / Details: assist needed to lift Rt LE.    Cervical / Trunk Assessment Cervical / Trunk Assessment: Normal   Communication Communication Communication: No difficulties   Cognition Arousal/Alertness: Lethargic Behavior During Therapy: WFL for tasks assessed/performed Overall Cognitive Status: Within Functional Limits for  tasks assessed                     General Comments       Exercises       Shoulder Instructions      Home Living Family/patient expects to be discharged to:: Private residence Living Arrangements: Non-relatives/Friends;Parent Available Help at Discharge: Family;Friend(s);Available PRN/intermittently Type of Home: House Home Access: Stairs to enter [employer] of Steps: 4 Entrance Stairs-Rails:  Right;Left Home Layout: Two level;Bed/bath upstairs Alternate Level Stairs-Number of Steps: 20 Alternate Level Stairs-Rails: Right;Left Bathroom Shower/Tub: Tub/shower unit;Door Shower/tub characteristics: Door [employer]: Standard Bathroom Accessibility: Yes How Accessible: Accessible via walker Home Equipment: Walker - 2 wheels;Cane - single point;Crutches   Additional Comments: Pt reports that his friends work during the day and his mother is at home but she would not be able to assist.       Prior Functioning/Environment Level of Independence: Independent with assistive device(s)        Comments: using rw or cane at home due to R knee pain    OT Diagnosis: Generalized weakness;Acute pain   OT Problem List: Decreased strength;Decreased range of motion;Decreased activity tolerance;Impaired balance (sitting and/or standing);Decreased safety awareness;Decreased knowledge of use of DME or AE;Decreased knowledge of precautions;Pain   OT Treatment/Interventions: Self-care/ADL training;DME and/or AE instruction;Therapeutic activities;Patient/family education    OT Goals(Current goals can be found in the care plan section) Acute Rehab OT Goals Patient Stated Goal: go home OT Goal Formulation: With patient Time For Goal Achievement: 01/12/16 Potential to Achieve Goals: Good ADL Goals Pt Will Perform Lower Body Bathing: with modified independence;sit to/from stand;with adaptive equipment Pt Will Perform Lower Body Dressing: with modified independence;with adaptive equipment;sit to/from stand Pt Will Transfer to Toilet: with modified independence;ambulating;bedside commode Pt Will Perform Tub/Shower Transfer: 3 in 1;ambulating;Tub transfer;rolling walker;with min guard assist (door)  OT Frequency: Min 3X/week   Barriers to D/C: Decreased caregiver support          Co-evaluation              End of Session Equipment Utilized During Treatment: Gait belt;Rolling  walker;Right knee immobilizer CPM Right Knee CPM Right Knee: Off Nurse Communication: Mobility status  Activity Tolerance: Patient tolerated treatment well Patient left: in chair;with call bell/phone within reach   Time: 1301-1320 OT Time Calculation (min): 19 min Charges:  OT General Charges $OT Visit: 1 Procedure OT Evaluation $OT Eval Moderate Complexity: 1 Procedure G-Codes:    Glyndon Tursi,HILLARY 2016/01/13, 1:57 PM   [facility], OTR/L  [phone] Jan 13, 2016

## 2016-01-05 NOTE — Progress Notes (Signed)
Subjective: Doing well.  Pain controlled.     Objective: Vital signs in last 24 hours: Temp:  [97.2 F (36.2 C)-98.9 F (37.2 C)] 97.5 F (36.4 C) (05/04 0500) Pulse Rate:  [56-90] 86 (05/04 0500) Resp:  [14-21] 18 (05/04 0500) BP: (82-157)/(62-90) 109/64 mmHg (05/04 0500) SpO2:  [97 %-100 %] 98 % (05/04 0500)  Intake/Output from previous day: 05/03 0701 - 05/04 0700 In: 2482.3 [P.O.:240; I.V.:2192.3; IV Piggyback:50] Out: 50 [Blood:50] Intake/Output this shift: Total I/O In: 690 [P.O.:690] Out: 2 [Urine:2]   Recent Labs  01/05/16 0638  HGB 11.7*    Recent Labs  01/05/16 0638  WBC 6.6  RBC 3.85*  HCT 36.1*  PLT 191    Recent Labs  01/05/16 0638  NA 135  K 3.7  CL 101  CO2 26  BUN 10  CREATININE 1.08  GLUCOSE 124*  CALCIUM 8.3*   No results for input(s): LABPT, INR in the last 72 hours.  Exam:  Patient very talkative.  States that it may be from the dilaudid.  Dressing c/d/i.  Calf nontender.   Assessment/Plan: States that he may not have a lot of assistance at home.  Will see how he does with PT.  May need short snf placement.  D/c dilaudid.    Gissell Barra M 01/05/2016, 11:53 AM

## 2016-01-05 NOTE — Progress Notes (Signed)
Utilization review completed.  

## 2016-01-05 NOTE — Progress Notes (Signed)
Physical Therapy Treatment Patient Details Name: Chad Roth MRN: [id number] DOB: [birth year] Today's Date: 01/05/2016    History of Present Illness Pt is a 60 y.o. male now s/p Rt TKA. PMH: femur fx.     PT Comments    Pt able to improve with his mobility during second PT session. Pt ambulating 40 feet with rw and min guard assist. At this time the patient is hoping to D/C to home but he has limited assistance available and mobilization has been slow. PT to continue to follow and modify D/C plan as needed if the patient is able to demonstrate improvement and ability to D/C to home.   Follow Up Recommendations  SNF     Equipment Recommendations  None recommended by PT    Recommendations for Other Services       Precautions / Restrictions Precautions Precautions: Fall;Knee Precaution Booklet Issued: Yes (comment) Precaution Comments: HEP provided, reviewed knee extension precautions Required Braces or Orthoses: Knee Immobilizer - Right Knee Immobilizer - Right:  (off for CPM and PT) Restrictions Weight Bearing Restrictions: Yes RLE Weight Bearing: Weight bearing as tolerated    Mobility  Bed Mobility Overal bed mobility: Needs Assistance Bed Mobility: Sit to Supine       Sit to supine: Min guard   General bed mobility comments: Pt able to use LLE to hook Rt for assist into bed.   Transfers Overall transfer level: Needs assistance Equipment used: Rolling walker (2 wheeled) Transfers: Sit to/from Stand Sit to Stand: Min guard         General transfer comment: good hand position  Ambulation/Gait Ambulation/Gait assistance: Min guard Ambulation Distance (Feet): 40 Feet Assistive device: Rolling walker (2 wheeled) Gait Pattern/deviations: Step-to pattern;Decreased weight shift to right Gait velocity: decreased, improved from first session.    General Gait Details: reinforcing weightbearing through Rt LE   Stairs            Wheelchair Mobility     Modified Rankin (Stroke Patients Only)       Balance Overall balance assessment: Needs assistance Sitting-balance support: No upper extremity supported Sitting balance-Leahy Scale: Good     Standing balance support: During functional activity Standing balance-Leahy Scale: Fair Standing balance comment: using rw during ambulation, pt able to stand without UE support to use urinal                    Cognition Arousal/Alertness: Lethargic Behavior During Therapy: WFL for tasks assessed/performed Overall Cognitive Status: Within Functional Limits for tasks assessed                      Exercises Total Joint Exercises Ankle Circles/Pumps: AROM;Both;10 reps Quad Sets: Strengthening;Right;10 reps Heel Slides: AAROM;Right;10 reps Hip ABduction/ADduction: Strengthening;Right;10 reps (mod assist) Straight Leg Raises: Strengthening;Right;10 reps (max assist) Goniometric ROM: 46 degrees knee flexion    General Comments        Pertinent Vitals/Pain Pain Assessment: 0-10 Pain Score: 4  Faces Pain Scale: Hurts a little bit Pain Location: Rt knee Pain Descriptors / Indicators: Aching Pain Intervention(s): Limited activity within patient's tolerance;Monitored during session    Home Living Family/patient expects to be discharged to:: Private residence Living Arrangements: Non-relatives/Friends;Parent Available Help at Discharge: Family;Friend(s);Available PRN/intermittently Type of Home: House Home Access: Stairs to enter Entrance Stairs-Rails: Right;Left Home Layout: Two level;Bed/bath upstairs Home Equipment: Walker - 2 wheels;Cane - single point;Crutches Additional Comments: Pt reports that his friends work during the day and his mother is at  home but she would not be able to assist.     Prior Function Level of Independence: Independent with assistive device(s)      Comments: using rw or cane at home due to R knee pain   PT Goals (current goals can now be  found in the care plan section) Acute Rehab PT Goals Patient Stated Goal: go home PT Goal Formulation: With patient Time For Goal Achievement: 01/19/16 Potential to Achieve Goals: Fair Progress towards PT goals: Progressing toward goals    Frequency  7X/week    PT Plan Current plan remains appropriate    Co-evaluation             End of Session Equipment Utilized During Treatment: Right knee immobilizer;Gait belt Activity Tolerance: Patient tolerated treatment well Patient left: in bed;with call bell/phone within reach;in CPM     Time: [id number] PT Time Calculation (min) (ACUTE ONLY): 35 min  Charges:  $Gait Training: 8-22 mins $Therapeutic Exercise: 8-22 mins                    G Codes:      Cassell Clement, PT, CSCS Pager [phone] Office [phone]  01/05/2016, 3:39 PM

## 2016-01-05 NOTE — Evaluation (Signed)
Physical Therapy Evaluation Patient Details Name: Chad Roth MRN: [id number] DOB: 1956/02/15 Today's Date: 01/05/2016   History of Present Illness  Pt is a 60 y.o. male now s/p Rt TKA. PMH: femur fx.   Clinical Impression  Pt is s/p TKA resulting in the deficits listed below (see PT Problem List). Based upon the patient's current mobility level, the patient may need short stay at SNF prior to returning home. The patient states that he is hoping to be able to D/C directly home but his mobility will have to progress to supervision/modified independent level.  PT to continue to follow and progress as tolerated. Further sessions will help clarify D/C plan.       Follow Up Recommendations  (SNF )    Equipment Recommendations  None recommended by PT    Recommendations for Other Services       Precautions / Restrictions Precautions Precautions: Fall;Knee Precaution Booklet Issued: Yes (comment) Precaution Comments: HEP provided, reviewed knee extension precautions Required Braces or Orthoses: Knee Immobilizer - Right Knee Immobilizer - Right:  (off for CPM and PT) Restrictions Weight Bearing Restrictions: Yes RLE Weight Bearing: Weight bearing as tolerated      Mobility  Bed Mobility               General bed mobility comments: pt up in chair upon arrival  Transfers Overall transfer level: Needs assistance Equipment used: Rolling walker (2 wheeled) Transfers: Sit to/from Stand Sit to Stand: Min assist         General transfer comment: cues for hand placement.   Ambulation/Gait Ambulation/Gait assistance: Min guard Ambulation Distance (Feet): 20 Feet Assistive device: Rolling walker (2 wheeled) Gait Pattern/deviations: Step-to pattern;Decreased step length - left;Decreased stance time - right;Decreased weight shift to right Gait velocity: very slow pattern   General Gait Details: encouraging weightbearing through Rt LE. Initially needing cues for gait sequence.    Stairs            Wheelchair Mobility    Modified Rankin (Stroke Patients Only)       Balance Overall balance assessment: Needs assistance Sitting-balance support: No upper extremity supported;Feet supported Sitting balance-Leahy Scale: Good     Standing balance support: Bilateral upper extremity supported Standing balance-Leahy Scale: Poor Standing balance comment: using rw for support                             Pertinent Vitals/Pain Pain Assessment: 0-10 Pain Score: 3  Pain Location: Rt knee Pain Descriptors / Indicators: Aching Pain Intervention(s): Limited activity within patient's tolerance;Monitored during session    Home Living Family/patient expects to be discharged to:: Private residence Living Arrangements: Non-relatives/Friends;Parent Available Help at Discharge: Family;Friend(s);Available PRN/intermittently Type of Home: House Home Access: Stairs to enter Entrance Stairs-Rails: Psychiatric nurse of Steps: 4 Home Layout: Two level;Bed/bath upstairs Home Equipment: Walker - 2 wheels;Cane - single point;Crutches Additional Comments: Pt reports that his friends work during the day and his mother is at home but she would not be able to assist.     Prior Function Level of Independence: Independent with assistive device(s)         Comments: using rw or cane at home     Hand Dominance        Extremity/Trunk Assessment   Upper Extremity Assessment: Defer to OT evaluation           Lower Extremity Assessment: RLE deficits/detail RLE Deficits / Details: assist needed  to lift Rt LE.        Communication   Communication: No difficulties  Cognition Arousal/Alertness: Lethargic Behavior During Therapy: WFL for tasks assessed/performed Overall Cognitive Status: Within Functional Limits for tasks assessed                      General Comments      Exercises        Assessment/Plan    PT  Assessment Patient needs continued PT services  PT Diagnosis Difficulty walking   PT Problem List Decreased strength;Decreased range of motion;Decreased activity tolerance;Decreased balance;Decreased mobility  PT Treatment Interventions Gait training;DME instruction;Stair training;Functional mobility training;Therapeutic activities;Therapeutic exercise;Balance training;Patient/family education   PT Goals (Current goals can be found in the Care Plan section) Acute Rehab PT Goals Patient Stated Goal: go home PT Goal Formulation: With patient Time For Goal Achievement: 01/19/16 Potential to Achieve Goals: Fair    Frequency 7X/week   Barriers to discharge Decreased caregiver support      Co-evaluation               End of Session Equipment Utilized During Treatment: Right knee immobilizer;Gait belt Activity Tolerance: Patient tolerated treatment well Patient left: in chair;with call bell/phone within reach (in knee extension) Nurse Communication: Mobility status;Weight bearing status         Time: [id number] PT Time Calculation (min) (ACUTE ONLY): 32 min   Charges:   PT Evaluation $PT Eval Moderate Complexity: 1 Procedure PT Treatments $Gait Training: 8-22 mins   PT G Codes:        Cassell Clement, PT, CSCS Pager [phone] Office [phone]  01/05/2016, 12:42 PM

## 2016-01-05 NOTE — Care Management Note (Addendum)
Case Management Note  Patient Details  Name: Balthazar Dupell MRN: [id number] Date of Birth: 10-Jun-1956  Subjective/Objective:         S/p right total knee arthroplasty           Action/Plan: PT recommending SNF if patient does not progress with therapy. Spoke with patient about discharge plan, he would prefer to go home but did not refuse SNF. He lives with a couple who work during the day, he would have limited assistance and his bedroom is on second level of house. He stated that he will speak with them tonight about availability of assistance after discharge. Gave choice for HHPT if he is able to discharge to home. He selected [facility]. He has a rolling walker but will need 3N1. Referral made to CSW. Will follow up 01/06/16 for discharge needs.  01/06/16 Spoke with PT and with patient, based on patient's progress recommendation is still SNF. Patient agreeable to SNF. Updated CSW. Will continue to follow.    Expected Discharge Date:                  Expected Discharge Plan:     In-House Referral:  Clinical Social Work  Discharge planning Services  CM Consult  Post Acute Care Choice:    Choice offered to:     DME Arranged:    DME Agency:     HH Arranged:    HH Agency:     Status of Service:  In process, will continue to follow  Medicare Important Message Given:    Date Medicare IM Given:    Medicare IM give by:    Date Additional Medicare IM Given:    Additional Medicare Important Message give by:     If discussed at [place] of Stay Meetings, dates discussed:    Additional Comments:  Nila Nephew, RN 01/05/2016, 3:49 PM

## 2016-01-05 NOTE — Progress Notes (Signed)
When assessing patient's pain.  Pt states it's much better/ tolerable. And still rates pain as 7. Then states I've got this pain medicine in me.  Pt appears to be more comfortable and tolerated PT.  Pt understands that his dilaudid is now DC'd.

## 2016-01-06 LAB — CBC
HCT: 34.3 % — ABNORMAL LOW (ref 39.0–52.0)
HEMOGLOBIN: 11.5 g/dL — AB (ref 13.0–17.0)
MCH: 31.8 pg (ref 26.0–34.0)
MCHC: 33.5 g/dL (ref 30.0–36.0)
MCV: 94.8 fL (ref 78.0–100.0)
Platelets: 221 10*3/uL (ref 150–400)
RBC: 3.62 MIL/uL — ABNORMAL LOW (ref 4.22–5.81)
RDW: 12.7 % (ref 11.5–15.5)
WBC: 8.6 10*3/uL (ref 4.0–10.5)

## 2016-01-06 LAB — BASIC METABOLIC PANEL
ANION GAP: 9 (ref 5–15)
BUN: 8 mg/dL (ref 6–20)
CHLORIDE: 99 mmol/L — AB (ref 101–111)
CO2: 27 mmol/L (ref 22–32)
CREATININE: 1.03 mg/dL (ref 0.61–1.24)
Calcium: 8.4 mg/dL — ABNORMAL LOW (ref 8.9–10.3)
Glucose, Bld: 123 mg/dL — ABNORMAL HIGH (ref 65–99)
Potassium: 3.2 mmol/L — ABNORMAL LOW (ref 3.5–5.1)
SODIUM: 135 mmol/L (ref 135–145)

## 2016-01-06 MED ORDER — METHOCARBAMOL 500 MG PO TABS: 500.0000 mg | ORAL_TABLET | Freq: Four times a day (QID) | ORAL | Status: DC | PRN

## 2016-01-06 MED ORDER — ASPIRIN 325 MG PO TBEC: 325.0000 mg | DELAYED_RELEASE_TABLET | Freq: Every day | ORAL | Status: DC

## 2016-01-06 MED ORDER — OXYCODONE-ACETAMINOPHEN 7.5-325 MG PO TABS: 1.0000 | ORAL_TABLET | Freq: Four times a day (QID) | ORAL | Status: DC | PRN

## 2016-01-06 NOTE — Progress Notes (Signed)
Physical Therapy Treatment Patient Details Name: Chad Roth MRN: [id number] DOB: 1956-02-04 Today's Date: 01/06/2016    History of Present Illness Pt is a 60 y.o. male now s/p Rt TKA. PMH: femur fx.     PT Comments    At this time the patient is making progress but slower than anticipated. Pt continues to require moderate assistance with the Rt LE for bed mobility and ambulation is limited in distance and quality. Education was again performed regarding need for knee extension stretch during the day. Based upon the patient's current mobility level, recommending SNF for further rehabilitation. PT will continue to follow and progress as tolerated.   Follow Up Recommendations  SNF;Supervision for mobility/OOB     Equipment Recommendations  None recommended by PT    Recommendations for Other Services       Precautions / Restrictions Precautions Precautions: Fall;Knee Required Braces or Orthoses: Knee Immobilizer - Right Knee Immobilizer - Right:  (off for CPM and PT) Restrictions Weight Bearing Restrictions: Yes RLE Weight Bearing: Weight bearing as tolerated    Mobility  Bed Mobility Overal bed mobility: Needs Assistance Bed Mobility: Supine to Sit     Supine to sit: Mod assist (with Rt LE)     General bed mobility comments: Pt using bedrail and mod assist with the Rt LE to get to sitting EOB from supine.   Transfers Overall transfer level: Needs assistance Equipment used: Rolling walker (2 wheeled) Transfers: Sit to/from Stand Sit to Stand: Min guard         General transfer comment: reminder to reach back for chair when sitting.   Ambulation/Gait Ambulation/Gait assistance: Min guard Ambulation Distance (Feet): 90 Feet Assistive device: Rolling walker (2 wheeled) Gait Pattern/deviations: Step-to pattern;Decreased weight shift to right;Decreased stance time - right;Trunk flexed Gait velocity: decreased   General Gait Details: Pt needing frequent verbal and  tactile cues for posture. Pt also demonstrates decreased weight shift to the Rt LE despite wearing Knee Immobilizer.    Stairs            Wheelchair Mobility    Modified Rankin (Stroke Patients Only)       Balance Overall balance assessment: Needs assistance Sitting-balance support: No upper extremity supported Sitting balance-Leahy Scale: Good     Standing balance support: During functional activity Standing balance-Leahy Scale: Fair Standing balance comment: using rw for ambulation                    Cognition Arousal/Alertness: Awake/alert Behavior During Therapy: WFL for tasks assessed/performed Overall Cognitive Status: Within Functional Limits for tasks assessed                      Exercises Total Joint Exercises Ankle Circles/Pumps: AROM;Both;10 reps Quad Sets: Strengthening;Right;10 reps Short Arc Quad: Strengthening;Right;10 reps (mod assist) Heel Slides: AAROM;Right;10 reps Hip ABduction/ADduction: Strengthening;Right;10 reps (mod assist) Straight Leg Raises: Strengthening;Right;10 reps (max assist) Knee Flexion: AROM;Right;10 reps Goniometric ROM: 18-55 degrees.     General Comments        Pertinent Vitals/Pain Pain Assessment: 0-10 Pain Score: 3  Pain Location: Rt knee Pain Descriptors / Indicators: Aching Pain Intervention(s): Limited activity within patient's tolerance;Monitored during session    Home Living                      Prior Function            PT Goals (current goals can now be found in the care  plan section) Acute Rehab PT Goals Patient Stated Goal: be able to move better.  PT Goal Formulation: With patient Time For Goal Achievement: 01/19/16 Potential to Achieve Goals: Fair Progress towards PT goals: Progressing toward goals    Frequency  7X/week    PT Plan Current plan remains appropriate    Co-evaluation             End of Session Equipment Utilized During Treatment: Right knee  immobilizer;Gait belt Activity Tolerance: Patient limited by fatigue Patient left: in chair;with call bell/phone within reach;Other (comment) (in knee extension stretch)     Time: [id number] PT Time Calculation (min) (ACUTE ONLY): 32 min  Charges:  $Gait Training: 8-22 mins $Therapeutic Exercise: 8-22 mins                    G Codes:      Cassell Clement, PT, CSCS Pager [phone] Office [phone]  01/06/2016, 3:33 PM

## 2016-01-06 NOTE — Progress Notes (Signed)
Subjective: Doing well.  Pain controlled. Slow moving with PT.  Patient states that he does not think that he will have any assistance at home.  Needs short snf placement for rehab.    Objective: Vital signs in last 24 hours: Temp:  [97.1 F (36.2 C)-99.4 F (37.4 C)] 99.4 F (37.4 C) (05/05 0500) Pulse Rate:  [77-89] 86 (05/05 0500) Resp:  [18] 18 (05/05 0500) BP: (137-151)/(71-86) 137/76 mmHg (05/05 0500) SpO2:  [95 %-98 %] 95 % (05/05 0500)  Intake/Output from previous day: 05/04 0701 - 05/05 0700 In: 1140 [P.O.:1140] Out: 3 [Urine:3] Intake/Output this shift:     Recent Labs  01/05/16 0638 01/06/16 0524  HGB 11.7* 11.5*    Recent Labs  01/05/16 0638 01/06/16 0524  WBC 6.6 8.6  RBC 3.85* 3.62*  HCT 36.1* 34.3*  PLT 191 221    Recent Labs  01/05/16 0638 01/06/16 0524  NA 135 135  K 3.7 3.2*  CL 101 99*  CO2 26 27  BUN 10 8  CREATININE 1.08 1.03  GLUCOSE 124* 123*  CALCIUM 8.3* 8.4*   No results for input(s): LABPT, INR in the last 72 hours.  Exam: alert and oriented.  Wound looks good.  No drainage or signs of infections.  Calf nontender. NVI.   Assessment/Plan: Limited to no assistance at home.  Will need short snf placement for rehab.  Discussed [facility] and he is agreeable to that.  Transfer when bed available.    OWENS,JAMES M 01/06/2016, 8:35 AM

## 2016-01-06 NOTE — NC FL2 (Signed)
[place] LEVEL OF CARE SCREENING TOOL     IDENTIFICATION  Patient Name: Chad Roth Birthdate: 24-Nov-1955 Sex: male Admission Date (Current Location): 01/04/2016  [place] and [place] Number:  Kathleen Argue [id number] [place] and Address:  [facility] [place]. [facility], [place] [address], [place], [place] [address]      Provider Number: [id number]  Attending Physician Name and Address:  Marybelle Killings, MD  Relative Name and Phone Number:  Cleon Gustin - Friend.  [phone]    Current Level of Care: Hospital Recommended Level of Care: [place] Prior Approval Number:    Date Approved/Denied:   PASRR Number: [id number] (Eff. 01/06/16)  Discharge Plan: SNF    Current Diagnoses: Patient Active Problem List   Diagnosis Date Noted  . Status post total right knee replacement 01/04/2016    Orientation RESPIRATION BLADDER Height & Weight     Self, Time, Situation, Place  Normal Continent Weight: 182 lb (82.555 kg) Height:  5\' 11"  (180.3 cm)  BEHAVIORAL SYMPTOMS/MOOD NEUROLOGICAL BOWEL NUTRITION STATUS      Continent Diet (Regular)  AMBULATORY STATUS COMMUNICATION OF NEEDS Skin   Limited Assist (Patient ambulated 40 ft with rolling walker on 5/4) Verbally Normal, Other (Comment) (Incision right knee)                       Personal Care Assistance Level of Assistance  Bathing, Feeding, Dressing Bathing Assistance: Limited assistance Feeding assistance: Independent Dressing Assistance: Maximum assistance     Functional Limitations Info  Sight, Hearing, Speech Sight Info: Adequate Hearing Info: Adequate Speech Info: Adequate    SPECIAL CARE FACTORS FREQUENCY  PT (By licensed PT), OT (By licensed OT)     PT Frequency: Evaluated 5/4 and a minimum of 7X per week therapy recommended. OT Frequency: Evaluated 5/4 and a minimum of 3X per week therapy recommended            Contractures Contractures Info: Not present    Additional  Factors Info  Code Status, Allergies Code Status Info: Full code Allergies Info: No known allergies           Current Medications (01/06/2016):  This is the current hospital active medication list Current Facility-Administered Medications  Medication Dose Route Frequency Provider Last Rate Last Dose  . 0.45 % sodium chloride infusion   Intravenous Continuous Lanae Crumbly, PA-C 85 mL/hr at 01/05/16 1531    . acetaminophen (TYLENOL) tablet 650 mg  650 mg Oral Q6H PRN Lanae Crumbly, PA-C       Or  . acetaminophen (TYLENOL) suppository 650 mg  650 mg Rectal Q6H PRN Lanae Crumbly, PA-C      . allopurinol (ZYLOPRIM) tablet 300 mg  300 mg Oral Daily Lanae Crumbly, PA-C   300 mg at 01/06/16 1008  . aspirin EC tablet 325 mg  325 mg Oral Q breakfast Lanae Crumbly, PA-C   325 mg at 01/06/16 1008  . docusate sodium (COLACE) capsule 100 mg  100 mg Oral BID Lanae Crumbly, PA-C   100 mg at 01/06/16 1008  . lactated ringers infusion   Intravenous Continuous Effie Berkshire, MD   Stopped at 01/04/16 1515  . menthol-cetylpyridinium (CEPACOL) lozenge 3 mg  1 lozenge Oral PRN Lanae Crumbly, PA-C       Or  . phenol (CHLORASEPTIC) mouth spray 1 spray  1 spray Mouth/Throat PRN Lanae Crumbly, PA-C      . methocarbamol (  ROBAXIN) tablet 500 mg  500 mg Oral Q6H PRN Lanae Crumbly, PA-C   500 mg at 01/06/16 1008   Or  . methocarbamol (ROBAXIN) 500 mg in dextrose 5 % 50 mL IVPB  500 mg Intravenous Q6H PRN Lanae Crumbly, PA-C      . metoCLOPramide (REGLAN) tablet 5-10 mg  5-10 mg Oral Q8H PRN Lanae Crumbly, PA-C       Or  . metoCLOPramide (REGLAN) injection 5-10 mg  5-10 mg Intravenous Q8H PRN Lanae Crumbly, PA-C      . ondansetron [facility]) tablet 4 mg  4 mg Oral Q6H PRN Lanae Crumbly, PA-C       Or  . ondansetron [facility]) injection 4 mg  4 mg Intravenous Q6H PRN Lanae Crumbly, PA-C      . oxyCODONE (Oxy IR/ROXICODONE) immediate release tablet 5-10 mg  5-10 mg Oral Q4H PRN Lanae Crumbly, PA-C   10 mg at 01/06/16 1414   . pantoprazole (PROTONIX) EC tablet 40 mg  40 mg Oral Daily Lanae Crumbly, PA-C   40 mg at 01/06/16 1008  . polyethylene glycol (MIRALAX / GLYCOLAX) packet 17 g  17 g Oral Daily PRN Lanae Crumbly, PA-C         Discharge Medications: Please see discharge summary for a list of discharge medications.  Relevant Imaging Results:  Relevant Lab Results:   Additional Information ss#[id number].  On 5/3 patient had computer assisted total knee arthroplasty.  Sable Feil, LCSW

## 2016-01-06 NOTE — Discharge Summary (Signed)
Patient ID: Chad Roth MRN: [id number] DOB/AGE: 61/26/1957 61 y.o.  Admit date: 01/04/2016 Discharge date: 01/06/2016  Admission Diagnoses:  Active Problems:   Status post total right knee replacement   Discharge Diagnoses:  Active Problems:   Status post total right knee replacement  status post Procedure(s): COMPUTER ASSISTED TOTAL KNEE ARTHROPLASTY  Past Medical History  Diagnosis Date  . GERD (gastroesophageal reflux disease)   . Arthritis     Surgeries: Procedure(s): COMPUTER ASSISTED TOTAL KNEE ARTHROPLASTY on 01/04/2016   Consultants:    Discharged Condition: Improved  Hospital Course: Chad Roth is an 60 y.o. male who was admitted 01/04/2016 for operative treatment of right knee DJD. Patient failed conservative treatments (please see the history and physical for the specifics) and had severe unremitting pain that affects sleep, daily activities and work/hobbies. After pre-op clearance, the patient was taken to the operating room on 01/04/2016 and underwent  Procedure(s): COMPUTER ASSISTED TOTAL KNEE ARTHROPLASTY.    Patient was given perioperative antibiotics: Anti-infectives    Start     Dose/Rate Route Frequency Ordered Stop   01/04/16 2000  ceFAZolin (ANCEF) IVPB 1 g/50 mL premix     1 g 100 mL/hr over 30 Minutes Intravenous Every 8 hours 01/04/16 1702 01/05/16 0821   01/04/16 1042  ceFAZolin (ANCEF) 2-4 GM/100ML-% IVPB    Comments:  Sammuel Cooper   : cabinet override      01/04/16 1042 01/04/16 2259   01/04/16 1038  ceFAZolin (ANCEF) IVPB 2g/100 mL premix     2 g 200 mL/hr over 30 Minutes Intravenous On call to O.R. 01/04/16 1038 01/04/16 1305       Patient was given sequential compression devices and early ambulation to prevent DVT.   Patient benefited maximally from hospital stay and there were no complications. At the time of discharge, the patient was urinating/moving their bowels without difficulty, tolerating a regular diet, pain is controlled with  oral pain medications and they have been cleared by PT/OT.   Recent vital signs: Patient Vitals for the past 24 hrs:  BP Temp Temp src Pulse Resp SpO2  01/06/16 0500 137/76 mmHg 99.4 F (37.4 C) Oral 86 18 95 %  01/05/16 2100 140/71 mmHg 97.6 F (36.4 C) Oral 89 18 98 %  01/05/16 1412 (!) 151/86 mmHg 97.1 F (36.2 C) Oral 77 18 96 %     Recent laboratory studies:  Recent Labs  01/05/16 0638 01/06/16 0524  WBC 6.6 8.6  HGB 11.7* 11.5*  HCT 36.1* 34.3*  PLT 191 221  NA 135 135  K 3.7 3.2*  CL 101 99*  CO2 26 27  BUN 10 8  CREATININE 1.08 1.03  GLUCOSE 124* 123*  CALCIUM 8.3* 8.4*     Discharge Medications:     Medication List    STOP taking these medications        naproxen sodium 220 MG tablet  Commonly known as:  ANAPROX      TAKE these medications        allopurinol 300 MG tablet  Commonly known as:  ZYLOPRIM  Take 300 mg by mouth daily.     aspirin 325 MG EC tablet  Take 1 tablet (325 mg total) by mouth daily.     methocarbamol 500 MG tablet  Commonly known as:  ROBAXIN  Take 1 tablet (500 mg total) by mouth every 6 (six) hours as needed for muscle spasms.     omeprazole 40 MG capsule  Commonly known as:  PRILOSEC  Take 40 mg by mouth daily.     oxyCODONE-acetaminophen 7.5-325 MG tablet  Commonly known as:  PERCOCET  Take 1 tablet by mouth every 6 (six) hours as needed for severe pain.        Diagnostic Studies: Dg Chest 2 View  12/29/2015  CLINICAL DATA:  Preop total knee arthroplasty EXAM: CHEST  2 VIEW COMPARISON:  None. FINDINGS: The heart size and mediastinal contours are within normal limits. Both lungs are clear. The visualized skeletal structures are unremarkable. IMPRESSION: No active cardiopulmonary disease. Electronically Signed   By: Skipper Cliche M.D.   On: 12/29/2015 10:57   Dg Knee Right Port  01/04/2016  CLINICAL DATA:  Total knee arthroplasty EXAM: PORTABLE RIGHT KNEE - 1-2 VIEW COMPARISON:  12/13/2015 FINDINGS: Total knee  arthroplasty has been placed. Anatomic alignment of the osseous and prostatic structures. No breakage or loosening of the hardware. Existing intra medullary rod is stable in the right femur. IMPRESSION: Total knee arthroplasty anatomically aligned. Electronically Signed   By: Marybelle Killings M.D.   On: 01/04/2016 16:27          Follow-up Information    Schedule an appointment as soon as possible for a visit with Marybelle Killings, MD.   Specialty:  Orthopedic Surgery   Why:  need return office visit 2 weeks postop   Contact information:   [place] [address] [phone]       Discharge Plan:  discharge to snf      Signed: Lanae Crumbly for Dr Rodell Perna [facility] orthopedics [phone] 01/06/2016, 1:24 PM

## 2016-01-06 NOTE — Progress Notes (Signed)
Occupational Therapy Treatment Patient Details Name: Khy Pitre MRN: [id number] DOB: April 13, 1956 Today's Date: 01/06/2016    History of present illness Pt is a 60 y.o. male now s/p Rt TKA. PMH: femur fx.    OT comments  Pt. Required increased encouragement for participation in skilled OT.  Declined eob/oob so session limited to education and demonstration of use of A/E.  Will plan for pt. To engage and return demo during next session.    Follow Up Recommendations  SNF    Equipment Recommendations  Other (comment) (provided a/e kit for pt., left in room)    Recommendations for Other Services      Precautions / Restrictions Precautions Precautions: Fall;Knee Restrictions RLE Weight Bearing: Weight bearing as tolerated       Mobility Bed Mobility                  Transfers                      Balance                                   ADL Overall ADL's : Needs assistance/impaired             Lower Body Bathing: With adaptive equipment;Bed level Lower Body Bathing Details (indicate cue type and reason): provided demonstration of use of A/E as pt. was refusing oob and eob for return demo due to pain     Lower Body Dressing: With adaptive equipment;Bed level Lower Body Dressing Details (indicate cue type and reason): provided demonstration of use of A/E as pt. was refusing oob and eob for return demo due to pain               General ADL Comments: education, demonstration and provided use of A/E for LB ADLS.  pt. encouraged for oob/eob but declined due to pain.        Vision                     Perception     Praxis      Cognition   Behavior During Therapy: WFL for tasks assessed/performed Overall Cognitive Status: Within Functional Limits for tasks assessed                       Extremity/Trunk Assessment               Exercises     Shoulder Instructions       General Comments       Pertinent Vitals/ Pain       Pain Assessment:  (did not rate but states "even just moving it a little hurts so bad") Pain Intervention(s): Limited activity within patient's tolerance  Home Living                                          Prior Functioning/Environment              Frequency Min 3X/week     Progress Toward Goals  OT Goals(current goals can now be found in the care plan section)  Progress towards OT goals: Progressing toward goals     Plan Discharge plan remains appropriate    Co-evaluation  End of Session Equipment Utilized During Treatment: Other (comment) (A/E)   Activity Tolerance Patient limited by pain   Patient Left in bed;with call bell/phone within reach   Nurse Communication          Time: 1053-1110 OT Time Calculation (min): 17 min  Charges: OT General Charges $OT Visit: 1 Procedure OT Treatments $Self Care/Home Management : 8-22 mins  Janice Coffin, COTA/L 01/06/2016, 11:18 AM

## 2016-01-06 NOTE — Care Management Note (Signed)
Case Management Note  Patient Details  Name: Chad Roth MRN: [id number] Date of Birth: [birth year]  Subjective/Objective:                 S/p right total knee arthroplasty   Action/Plan: Patient unable to go to SNF, agreeable with discharge home with HHPT. Gave patient choice, he selected [facility]. Contacted Tiffany at [facility] and set up [facility]. Patient has rolling walker, will need 3N1. Left message for Jeneen Rinks at [facility] to deliver 3N1 to patient's room 01/07/16 am.      Expected Discharge Date:                  Expected Discharge Plan:  [place]  In-House Referral:  Clinical Social Work  Discharge planning Services  CM Consult  Post Acute Care Choice:  Durable Medical Equipment, Home Health Choice offered to:     DME Arranged:    DME Agency:     HH Arranged:  PT [place]:  [place]  Status of Service:  In process, will continue to follow  Medicare Important Message Given:    Date Medicare IM Given:    Medicare IM give by:    Date Additional Medicare IM Given:    Additional Medicare Important Message give by:     If discussed at [place] of Stay Meetings, dates discussed:    Additional Comments:  Nila Nephew, RN 01/06/2016, 5:08 PM

## 2016-01-06 NOTE — Clinical Social Work Note (Signed)
CSW completed assessment with patient regarding short-term rehab at a skilled facility. Patient has Medicaid only, and has applied for disability and using a lawyer to assist in getting his disability. CSW transmitted patient's information to facilities through the [facility] and consulted with social work Surveyor, quantity, Nathaniel Man regarding patient. [name] will need to d/c home due to having Medicaid only (does not pay for rehab) and on 5/5 he is walked 90 feet with a rolling walker - minimum assist.  CSW informed patient, nurse case manager and patient bedside nurse.  CSW signing off.  Chad Roth, MSW, LCSW Licensed Clinical Social Worker [place] [phone]

## 2016-01-06 NOTE — Discharge Instructions (Signed)

## 2016-01-07 LAB — BASIC METABOLIC PANEL
ANION GAP: 9 (ref 5–15)
BUN: 7 mg/dL (ref 6–20)
CALCIUM: 8.3 mg/dL — AB (ref 8.9–10.3)
CO2: 29 mmol/L (ref 22–32)
Chloride: 98 mmol/L — ABNORMAL LOW (ref 101–111)
Creatinine, Ser: 0.97 mg/dL (ref 0.61–1.24)
GFR calc Af Amer: >60 mL/min (ref >60)
GFR calc non Af Amer: >60 mL/min (ref >60)
Glucose, Bld: 103 mg/dL — ABNORMAL HIGH (ref 65–99)
POTASSIUM: 3.4 mmol/L — AB (ref 3.5–5.1)
SODIUM: 136 mmol/L (ref 135–145)

## 2016-01-07 LAB — CBC
HCT: 30.8 % — ABNORMAL LOW (ref 39.0–52.0)
Hemoglobin: 10.5 g/dL — ABNORMAL LOW (ref 13.0–17.0)
MCH: 31.7 pg (ref 26.0–34.0)
MCHC: 34.1 g/dL (ref 30.0–36.0)
MCV: 93.1 fL (ref 78.0–100.0)
PLATELETS: 213 10*3/uL (ref 150–400)
RBC: 3.31 MIL/uL — AB (ref 4.22–5.81)
RDW: 12.7 % (ref 11.5–15.5)
WBC: 6.6 10*3/uL (ref 4.0–10.5)

## 2016-01-07 MED ORDER — OXYCODONE-ACETAMINOPHEN 7.5-325 MG PO TABS: 2.0000 | ORAL_TABLET | Freq: Four times a day (QID) | ORAL | Status: DC | PRN

## 2016-01-07 MED ORDER — ASPIRIN 325 MG PO TBEC: 325.0000 mg | DELAYED_RELEASE_TABLET | Freq: Every day | ORAL | Status: DC

## 2016-01-07 MED ORDER — METHOCARBAMOL 500 MG PO TABS: 500.0000 mg | ORAL_TABLET | Freq: Four times a day (QID) | ORAL | Status: DC | PRN

## 2016-01-07 NOTE — Progress Notes (Signed)
Physical Therapy Treatment Patient Details Name: Chad Roth MRN: [id number] DOB: 1955/10/20 Today's Date: 01/07/2016    History of Present Illness Pt is a 60 y.o. male now s/p Rt TKA. PMH: femur fx.     PT Comments    Pt performed increased mobility and presents with improve motion in R knee after measuring.  Pt performed therapeutic exercise, gait and transfer training.  Pt reports he is going home at d/c despite recommendations for rehab.  Will continue to follow during acute hosptialization.    Follow Up Recommendations  SNF;Supervision for mobility/OOB     Equipment Recommendations  None recommended by PT    Recommendations for Other Services       Precautions / Restrictions Precautions Precautions: Fall;Knee Precaution Booklet Issued: Yes (comment) Precaution Comments: HEP provided, reviewed knee extension precautions Required Braces or Orthoses: Knee Immobilizer - Right Knee Immobilizer - Right:  (off for CPM and PT) Restrictions Weight Bearing Restrictions: Yes RLE Weight Bearing: Weight bearing as tolerated    Mobility  Bed Mobility Overal bed mobility: Needs Assistance Bed Mobility: Supine to Sit     Supine to sit: Min guard;Min assist     General bed mobility comments: Pt presents with heavy use of bed rail and assist of non operative leg to move RLE out of bed.  Pt use PTA as railing for trunk elevation and scooting to edge of seated surface.    Transfers Overall transfer level: Needs assistance Equipment used: Rolling walker (2 wheeled) Transfers: Sit to/from Stand Sit to Stand: Min guard         General transfer comment: Cues for hand placement as pt attempts to reach and pull on RW.  Pt required cues for forward advancement of RLE to decreased pain and improve eccentric loading.    Ambulation/Gait Ambulation/Gait assistance: Min guard Ambulation Distance (Feet): 100 Feet (x2 trials.  Pt require rest break back in room in standing to use urinal  ) Assistive device: Rolling walker (2 wheeled) Gait Pattern/deviations: Step-through pattern;Antalgic;Trunk flexed;Decreased dorsiflexion - right Gait velocity: decreased   General Gait Details: Pt remains to required frequent verbal and tactile cueing for forward gaze and postural awareness.  Pt required cues for R heel strike and R knee flexion in swing phase.    Stairs            Wheelchair Mobility    Modified Rankin (Stroke Patients Only)       Balance Overall balance assessment: Needs assistance Sitting-balance support: No upper extremity supported Sitting balance-Leahy Scale: Good       Standing balance-Leahy Scale: Fair                      Cognition Arousal/Alertness: Awake/alert Behavior During Therapy: WFL for tasks assessed/performed Overall Cognitive Status: Within Functional Limits for tasks assessed                      Exercises Total Joint Exercises Ankle Circles/Pumps: AROM;Both;10 reps Quad Sets: Strengthening;Right;10 reps Heel Slides: AAROM;Right;10 reps Hip ABduction/ADduction: Strengthening;Right;10 reps Straight Leg Raises: Strengthening;Right;10 reps Knee Flexion: AROM;Right;5 reps (with 10 sec hold) Goniometric ROM: 10-69 degrees.      General Comments        Pertinent Vitals/Pain Pain Assessment: 0-10 Pain Score: 6  Pain Location: R knee with movement. Pain Descriptors / Indicators: Sore;Tightness Pain Intervention(s): Monitored during session;Repositioned;Ice applied;Premedicated before session    Home Living  Prior Function            PT Goals (current goals can now be found in the care plan section) Acute Rehab PT Goals Patient Stated Goal: be able to move better.  Potential to Achieve Goals: Fair Progress towards PT goals: Progressing toward goals    Frequency  7X/week    PT Plan Current plan remains appropriate    Co-evaluation             End of Session  Equipment Utilized During Treatment: Gait belt Activity Tolerance: Patient limited by fatigue Patient left: in chair;with call bell/phone within reach;Other (comment) (with foam under ankle to promote knee extension.  )     Time: [id number] PT Time Calculation (min) (ACUTE ONLY): 27 min  Charges:  $Gait Training: 8-22 mins $Therapeutic Exercise: 8-22 mins                    G Codes:      Cristela Blue 01/18/16, 8:56 AM Governor Rooks, PTA pager [phone]

## 2016-01-07 NOTE — Progress Notes (Signed)
Patient ID: Chad Roth, male   DOB: [birth year], 60 y.o.   MRN: [id number] Apparently needs to be discharged to home since does not qualify for SNF.  No acute changes and vitals stable.  Will discahrge to home today.

## 2016-01-07 NOTE — Progress Notes (Addendum)
Physical Therapy Treatment Patient Details Name: Chad Roth MRN: [id number] DOB: [birth year] Today's Date: 01/07/2016    History of Present Illness Pt is a 60 y.o. male now s/p Rt TKA. PMH: femur fx.     PT Comments    Pt performed increased gait and successful completion of stair training in prep for d/c home.  Pt reports he understands and able to verbalize how frequent to perform HEP at home.    Follow Up Recommendations  SNF;Supervision for mobility/OOB     Equipment Recommendations  None recommended by PT    Recommendations for Other Services       Precautions / Restrictions Precautions Precautions: Fall;Knee Precaution Booklet Issued: Yes (comment) Precaution Comments: HEP provided, reviewed knee extension precautions Required Braces or Orthoses: Knee Immobilizer - Right Knee Immobilizer - Right:  (off for CPM and PT) Restrictions Weight Bearing Restrictions: Yes RLE Weight Bearing: Weight bearing as tolerated    Mobility  Bed Mobility Overal bed mobility: Needs Assistance Bed Mobility: Supine to Sit     Supine to sit: Min guard;Min assist     General bed mobility comments: Pt received in recliner chair.    Transfers Overall transfer level: Needs assistance Equipment used: Rolling walker (2 wheeled) Transfers: Sit to/from Stand Sit to Stand: Supervision         General transfer comment: Cues for safety.    Ambulation/Gait Ambulation/Gait assistance: Supervision Ambulation Distance (Feet): 150 Feet Assistive device: Rolling walker (2 wheeled) Gait Pattern/deviations: Step-through pattern;Antalgic;Trunk flexed;Decreased dorsiflexion - right Gait velocity: decreased   General Gait Details: Cues for forward gaze and L heel strike.     Stairs Stairs: Yes Stairs assistance: Supervision Stair Management: One rail Left;Step to pattern;Forwards;Backwards Number of Stairs: 10 General stair comments: Forward ascending and backwards descending.  Pt  descended x4 forwards with R rail before switching to negotiate remaining 6 stairs backwards.  pt reports more comfort with backwards technqiue.    Wheelchair Mobility    Modified Rankin (Stroke Patients Only)       Balance Overall balance assessment: Needs assistance Sitting-balance support: No upper extremity supported Sitting balance-Leahy Scale: Good       Standing balance-Leahy Scale: Fair                      Cognition Arousal/Alertness: Awake/alert Behavior During Therapy: WFL for tasks assessed/performed Overall Cognitive Status: Within Functional Limits for tasks assessed                      Exercises Total Joint Exercises Ankle Circles/Pumps:  (Pt reports he in confident in HEP.  )    General Comments        Pertinent Vitals/Pain Pain Assessment: 0-10 Pain Score: 5  Pain Location: R knee with movement Pain Descriptors / Indicators: Sore;Operative site guarding Pain Intervention(s): Monitored during session;Repositioned    Home Living                      Prior Function            PT Goals (current goals can now be found in the care plan section) Acute Rehab PT Goals Patient Stated Goal: be able to move better.  Potential to Achieve Goals: Good Progress towards PT goals: Progressing toward goals    Frequency  7X/week    PT Plan Current plan remains appropriate    Co-evaluation  End of Session Equipment Utilized During Treatment: Gait belt Activity Tolerance: Patient limited by fatigue Patient left: in chair;with call bell/phone within reach;Other (comment) (Pt waiting to get dressed for d/c home.  )     Time: [id number] PT Time Calculation (min) (ACUTE ONLY): 25 min  Charges:  $Gait Training: 23-37 mins                    G Codes:      Cristela Blue 01-23-16, 11:38 AM  Governor Rooks, PTA pager [phone]

## 2016-01-07 NOTE — Progress Notes (Signed)
Cm called Jeneen Rinks with [facility] to request RW and 3N1 be delivered to the bedside. CM spoke with Roland Rack, RN to please outreach the provider to obtain face to face.

## 2016-01-07 NOTE — Progress Notes (Signed)
Discharge instructions and prescriptions provided to patient.  IV removed.  Patient belongings with patient.  Front wheel walker and bedside commode provided to patient.  No questions at time of discharge.  Escorted via wheelchair with [place], NT.  Patient declined to wait for front wheel walker to be delivered to room.

## 2016-01-07 NOTE — Discharge Summary (Signed)
Patient ID: Chad Roth MRN: [id number] DOB/AGE: Sep 14, 1955 60 y.o.  Admit date: 01/04/2016 Discharge date: 01/07/2016  Admission Diagnoses:  Active Problems:   Status post total right knee replacement   Discharge Diagnoses:  Same  Past Medical History  Diagnosis Date  . GERD (gastroesophageal reflux disease)   . Arthritis     Surgeries: Procedure(s): COMPUTER ASSISTED TOTAL KNEE ARTHROPLASTY on 01/04/2016   Consultants:    Discharged Condition: Improved  Hospital Course: Chad Roth is an 60 y.o. male who was admitted 01/04/2016 for operative treatment of<principal problem not specified>. Patient has severe unremitting pain that affects sleep, daily activities, and work/hobbies. After pre-op clearance the patient was taken to the operating room on 01/04/2016 and underwent  Procedure(s): COMPUTER ASSISTED TOTAL KNEE ARTHROPLASTY.    Patient was given perioperative antibiotics: Anti-infectives    Start     Dose/Rate Route Frequency Ordered Stop   01/04/16 2000  ceFAZolin (ANCEF) IVPB 1 g/50 mL premix     1 g 100 mL/hr over 30 Minutes Intravenous Every 8 hours 01/04/16 1702 01/05/16 0821   01/04/16 1042  ceFAZolin (ANCEF) 2-4 GM/100ML-% IVPB    Comments:  Sammuel Cooper   : cabinet override      01/04/16 1042 01/04/16 2259   01/04/16 1038  ceFAZolin (ANCEF) IVPB 2g/100 mL premix     2 g 200 mL/hr over 30 Minutes Intravenous On call to O.R. 01/04/16 1038 01/04/16 1305       Patient was given sequential compression devices, early ambulation, and chemoprophylaxis to prevent DVT.  Patient benefited maximally from hospital stay and there were no complications.    Recent vital signs: Patient Vitals for the past 24 hrs:  BP Temp Temp src Pulse Resp SpO2  01/06/16 2110 119/70 mmHg 98.2 F (36.8 C) - 73 18 96 %  01/06/16 1300 128/77 mmHg 98.7 F (37.1 C) Oral 88 20 95 %     Recent laboratory studies:  Recent Labs  01/06/16 0524 01/07/16 0554  WBC 8.6 6.6  HGB 11.5* 10.5*   HCT 34.3* 30.8*  PLT 221 213  NA 135 136  K 3.2* 3.4*  CL 99* 98*  CO2 27 29  BUN 8 7  CREATININE 1.03 0.97  GLUCOSE 123* 103*  CALCIUM 8.4* 8.3*     Discharge Medications:     Medication List    STOP taking these medications        naproxen sodium 220 MG tablet  Commonly known as:  ANAPROX      TAKE these medications        allopurinol 300 MG tablet  Commonly known as:  ZYLOPRIM  Take 300 mg by mouth daily.     aspirin 325 MG EC tablet  Take 1 tablet (325 mg total) by mouth daily.     methocarbamol 500 MG tablet  Commonly known as:  ROBAXIN  Take 1 tablet (500 mg total) by mouth every 6 (six) hours as needed for muscle spasms.     omeprazole 40 MG capsule  Commonly known as:  PRILOSEC  Take 40 mg by mouth daily.     oxyCODONE-acetaminophen 7.5-325 MG tablet  Commonly known as:  PERCOCET  Take 2 tablets by mouth every 6 (six) hours as needed for severe pain.        Diagnostic Studies: Dg Chest 2 View  12/29/2015  CLINICAL DATA:  Preop total knee arthroplasty EXAM: CHEST  2 VIEW COMPARISON:  None. FINDINGS: The heart size and mediastinal contours are within  normal limits. Both lungs are clear. The visualized skeletal structures are unremarkable. IMPRESSION: No active cardiopulmonary disease. Electronically Signed   By: Skipper Cliche M.D.   On: 12/29/2015 10:57   Dg Knee Right Port  01/04/2016  CLINICAL DATA:  Total knee arthroplasty EXAM: PORTABLE RIGHT KNEE - 1-2 VIEW COMPARISON:  12/13/2015 FINDINGS: Total knee arthroplasty has been placed. Anatomic alignment of the osseous and prostatic structures. No breakage or loosening of the hardware. Existing intra medullary rod is stable in the right femur. IMPRESSION: Total knee arthroplasty anatomically aligned. Electronically Signed   By: Marybelle Killings M.D.   On: 01/04/2016 16:27    Disposition:  To home      Discharge Instructions    Discharge patient    Complete by:  As directed            Follow-up  Information    Schedule an appointment as soon as possible for a visit with Marybelle Killings, MD.   Specialty:  Orthopedic Surgery   Why:  need return office visit 2 weeks postop   Contact information:   [place] [address] [phone]       Follow up with [facility].   Why:  They will contact you to schedule home therapy visits.    Contact information:   [address] [place] [address] [phone]       Follow up with Marybelle Killings, MD. Schedule an appointment as soon as possible for a visit in 2 weeks.   Specialty:  Orthopedic Surgery   Contact information:   [place] [address] [phone]        Signed: Mcarthur Rossetti 01/07/2016, 10:23 AM

## 2016-01-24 ENCOUNTER — Encounter: Payer: Self-pay | Admitting: Physical Therapy

## 2016-01-24 ENCOUNTER — Ambulatory Visit: Payer: Medicaid Other | Attending: Orthopaedic Surgery | Admitting: Physical Therapy

## 2016-01-24 DIAGNOSIS — M25561 Pain in right knee: Secondary | ICD-10-CM | POA: Diagnosis present

## 2016-01-24 DIAGNOSIS — M25661 Stiffness of right knee, not elsewhere classified: Secondary | ICD-10-CM | POA: Diagnosis present

## 2016-01-24 DIAGNOSIS — R2689 Other abnormalities of gait and mobility: Secondary | ICD-10-CM | POA: Insufficient documentation

## 2016-01-24 NOTE — Therapy (Signed)
[facility] [address] [place], [place], [address] Phone: [phone]   Fax:  [phone]  Physical Therapy Evaluation  Patient Details  Name: Chad Roth MRN: [id number] Date of Birth: May 19, 1956 Referring Provider: Dr. Rodell Perna  Encounter Date: 01/24/2016      PT End of Session - 01/24/16 1055    Visit Number 1   Date for PT Re-Evaluation 02/28/16   Authorization Type Medicaid   PT Start Time [id number]   PT Stop Time 1115   PT Time Calculation (min) 60 min   Activity Tolerance Patient tolerated treatment well   Behavior During Therapy [facility] for tasks assessed/performed      Past Medical History  Diagnosis Date  . GERD (gastroesophageal reflux disease)   . Arthritis     Past Surgical History  Procedure Laterality Date  . Leg surgery      RIGHT  . Bilateral carpal tunnel release    . Knee arthroplasty Right 01/04/2016    Procedure: COMPUTER ASSISTED TOTAL KNEE ARTHROPLASTY;  Surgeon: Marybelle Killings, MD;  Location: [place];  Service: Orthopedics;  Laterality: Right;    There were no vitals filed for this visit.       Subjective Assessment - 01/24/16 1025    Subjective Patient had right knee TKR on 01/04/2016.  He has had 4 home health visits.  Patient was in the hospital for 5 days. Goes up and down steps with one step at a time.    Patient Stated Goals increased right knee ROM and strength   Currently in Pain? Yes   Pain Score 2    Pain Location Knee   Pain Orientation Right   Pain Descriptors / Indicators Discomfort   Pain Type Surgical pain   Pain Onset 1 to 4 weeks ago   Pain Frequency Intermittent   Aggravating Factors  sleep or use the knee in a weird day   Pain Relieving Factors rest   Multiple Pain Sites No            [facility] PT Assessment - 01/24/16 0001    Assessment   Medical Diagnosis TKA 01/04/2016 right   Referring Provider Dr. Rodell Perna   Onset Date/Surgical Date 01/04/16   Prior  Therapy 4 home healt visits   Precautions   Precautions None   Restrictions   Weight Bearing Restrictions No   Balance Screen   Has the patient fallen in the past 6 months No   Has the patient had a decrease in activity level because of a fear of falling?  No   Is the patient reluctant to leave their home because of a fear of falling?  No   Home Ecologist residence   Prior Function   Level of Independence Independent   Cognition   Overall Cognitive Status Within Functional Limits for tasks assessed   Observation/Other Assessments   Skin Integrity decreased mobilty of scar that is healing   Focus on Therapeutic Outcomes (FOTO)  53% limitation  goal is 39% limitation   Observation/Other Assessments-Edema    Edema Circumferential   Circumferential Edema   Circumferential - Left  knee joint 41 cm   ROM / Strength   AROM / PROM / Strength AROM;PROM;Strength   AROM   AROM Assessment Site Knee   Right/Left Knee Right   Right Knee Extension -15  sit   Right Knee Flexion 95   PROM   PROM Assessment Site Knee  Right/Left Knee Right   Right Knee Extension -10   Right Knee Flexion 98   Strength   Strength Assessment Site Knee   Right/Left Knee Right   Right Knee Flexion 3+/5   Right Knee Extension 3+/5   Flexibility   Soft Tissue Assessment /Muscle Length yes   Hamstrings tight on right   Quadriceps tight on right   Palpation   Patella mobility decreased on right   Ambulation/Gait   Ambulation/Gait Yes   Assistive device None   Gait Pattern Decreased step length - right;Decreased stance time - right;Decreased dorsiflexion - right;Decreased hip/knee flexion - right;Decreased weight shift to right   Stairs Yes   Stair Management Technique Step to pattern                   [facility] Adult PT Treatment/Exercise - 01/24/16 0001    Ambulation/Gait   Ambulation Surface Level                PT Education - 01/24/16 1105     Education provided No             PT Long Term Goals - 01/24/16 1102    PT LONG TERM GOAL #1   Title independent with HEP   Baseline not educated yet   Time 5   Period Weeks   Status New   PT LONG TERM GOAL #2   Title bend right knee >/= 110 AROM to go up and down steps with greater ease   Baseline 95 degrees flexion AROM   Time 5   Period Weeks   Status New   PT LONG TERM GOAL #3   Title extend right knee AROM >/= -5 degrees to imporve fluid movement of gait   Baseline -15 degrees   Time 5   Period Weeks   Status New   PT LONG TERM GOAL #4   Title right knee strength >/= 4/5 so he can get out of the car with greater ease   Baseline right knee strength is 3+/5   Time 5   Period Weeks   Status New               Plan - 01/24/16 1109    Clinical Impression Statement Patient is a 60 year old male with diagnosis of righ total knee replacement on 01/04/2016.  Patient was in the hospital for 4 days and 4 home health physical therpay treatment.  Patient reports his right knee pain is  2/10 that is intermittent with incresaed pain with quick movement. Right knee circumference is 41cm.  Right knee AROM is -15 to 95 degrees.  Right knee PrOM is -10 to 98 degrees.  Right knee strength is 3+/5. Patient has tight quads and hamstring.  Decreased mobility of righ tpatella and healing scar.  .Patient goes up and down stairs with step  to step pattern.  Patient ambulates with decreased step length on right, decreased right dorsifleixon, decreased right knee flexion and extension.  Patient is of low complexity.  Patient witll benefit form physical therapy to reduce pain and improve mobility.    Rehab Potential Excellent   Clinical Impairments Affecting Rehab Potential None   PT Frequency 1x / week   PT Duration --  5 weeks   PT Treatment/Interventions Cryotherapy;Electrical Stimulation;Gait training;Stair training;Functional mobility training;Therapeutic activities;Therapeutic  exercise;Patient/family education;Neuromuscular re-education;Manual techniques;Vasopneumatic Device;Passive range of motion   PT Next Visit Plan focus on HEP and knee ROM due to coming 1 time per  week, only has 4 visits left   PT Home Exercise Plan weight shift exericise, step ups, knee theraband HEP   Recommended Other Services None   Consulted and Agree with Plan of Care Patient      Patient will benefit from skilled therapeutic intervention in order to improve the following deficits and impairments:  Abnormal gait, Pain, Increased fascial restricitons, Decreased scar mobility, Decreased mobility, Increased muscle spasms, Decreased strength, Decreased range of motion, Decreased endurance, Decreased activity tolerance, Difficulty walking, Increased edema  Visit Diagnosis: Other abnormalities of gait and mobility - Plan: PT plan of care cert/re-cert  Pain in right knee - Plan: PT plan of care cert/re-cert  Stiffness of right knee, not elsewhere classified - Plan: PT plan of care cert/re-cert     Problem List Patient Active Problem List   Diagnosis Date Noted  . Status post total right knee replacement 01/04/2016    Earlie Counts, PT 01/24/2016 11:21 AM   [facility] [address], [place], [place], [address] Phone: [phone]   Fax:  [phone]  Name: Yashua Ganesan MRN: [id number] Date of Birth: [birth year]

## 2016-02-15 ENCOUNTER — Ambulatory Visit: Payer: Medicaid Other | Attending: Orthopaedic Surgery | Admitting: Physical Therapy

## 2016-02-15 ENCOUNTER — Encounter: Payer: Self-pay | Admitting: Physical Therapy

## 2016-02-15 DIAGNOSIS — M25561 Pain in right knee: Secondary | ICD-10-CM | POA: Diagnosis present

## 2016-02-15 DIAGNOSIS — M25661 Stiffness of right knee, not elsewhere classified: Secondary | ICD-10-CM | POA: Diagnosis present

## 2016-02-15 DIAGNOSIS — R2689 Other abnormalities of gait and mobility: Secondary | ICD-10-CM

## 2016-02-15 NOTE — Therapy (Signed)
[facility] [address] [place], [place], [address] Phone: [phone]   Fax:  [phone]  Physical Therapy Treatment  Patient Details  Name: Chad Roth MRN: [id number] Date of Birth: 10-12-1955 Referring Provider: Dr. Rodell Roth  Encounter Date: 02/15/2016      PT End of Session - 02/15/16 0814    Visit Number 2   Date for PT Re-Evaluation 02/28/16   Authorization Type Medicaid   PT Start Time 0800   PT Stop Time 0902   PT Time Calculation (min) 62 min   Activity Tolerance Patient tolerated treatment well   Behavior During Therapy [facility] for tasks assessed/performed      Past Medical History  Diagnosis Date  . GERD (gastroesophageal reflux disease)   . Arthritis     Past Surgical History  Procedure Laterality Date  . Leg surgery      RIGHT  . Bilateral carpal tunnel release    . Knee arthroplasty Right 01/04/2016    Procedure: COMPUTER ASSISTED TOTAL KNEE ARTHROPLASTY;  Surgeon: Chad Killings, MD;  Location: [place];  Service: Orthopedics;  Laterality: Right;    There were no vitals filed for this visit.      Subjective Assessment - 02/15/16 0811    Subjective Patient reports only a little pain rated as 2-3/10, complains of pain in Rt gluteal area he used heat to help reduce. Rt knee with swelling    Patient Stated Goals increased right knee ROM and strength   Currently in Pain? Yes   Pain Score 3    Pain Location Knee   Pain Orientation Right   Pain Descriptors / Indicators Discomfort   Pain Type Surgical pain   Pain Onset 1 to 4 weeks ago   Pain Frequency Intermittent   Aggravating Factors  sleep or use the knee   Pain Relieving Factors resting   Multiple Pain Sites No                         [facility] Adult PT Treatment/Exercise - 02/15/16 0001    Exercises   Exercises Knee/Hip   Knee/Hip Exercises: Stretches   Active Hamstring Stretch Right;3 reps;20 seconds  in sitting and  standing   Quad Stretch Right;3 reps;20 seconds  on stairs   Knee/Hip Exercises: Aerobic   Stationary Bike L 1 x 10 initially with compensatory movement fatique at end   Knee/Hip Exercises: Standing   Rebounder 3 directions x 1 min with B UE support   Modalities   Modalities Vasopneumatic   Vasopneumatic   Number Minutes Vasopneumatic  15 minutes   Vasopnuematic Location  Knee   Vasopneumatic Pressure High   Vasopneumatic Temperature  3 snowflakes                PT Education - 02/15/16 0842    Education provided Yes   Education Details step up 3 directions on stairs, HS stretch in sitting, supine, stairs   Person(s) Educated Patient   Methods Handout   Comprehension Returned demonstration;Verbalized understanding             PT Long Term Goals - 02/15/16 0819    PT LONG TERM GOAL #1   Title independent with HEP   Baseline not educated yet   Time 5   Period Weeks   Status On-going   PT LONG TERM GOAL #2   Title bend right knee >/= 110 AROM to go up and down  steps with greater ease   Baseline 95 degrees flexion AROM   Time 5   Period Weeks   Status On-going   PT LONG TERM GOAL #3   Title extend right knee AROM >/= -5 degrees to imporve fluid movement of gait   Baseline -15 degrees   Time 5   Period Weeks   Status On-going   PT LONG TERM GOAL #4   Title right knee strength >/= 4/5 so he can get out of the car with greater ease   Baseline right knee strength is 3+/5   Time 5   Period Weeks   Status On-going               Plan - 02/15/16 0815    Clinical Impression Statement Patient is a 60 year old male after Rt TKA on 01/04/2016. Patient is able for full revolution on the stationary bike. Pt with tight quadriceps and hamstrings. Patient will benefit from PT to reduce pain increase ROM strength and endurance.     Rehab Potential Excellent   Clinical Impairments Affecting Rehab Potential None   PT Frequency 1x / week   PT Treatment/Interventions  Cryotherapy;Electrical Stimulation;Gait training;Stair training;Functional mobility training;Therapeutic activities;Therapeutic exercise;Patient/family education;Neuromuscular re-education;Manual techniques;Vasopneumatic Device;Passive range of motion   PT Next Visit Plan focus on HEP and knee ROM due to coming 1 time per week, only has 4 visits left   PT Home Exercise Plan weight shift exericise, step ups, knee theraband HEP   Consulted and Agree with Plan of Care Patient      Patient will benefit from skilled therapeutic intervention in order to improve the following deficits and impairments:  Abnormal gait, Pain, Increased fascial restricitons, Decreased scar mobility, Decreased mobility, Increased muscle spasms, Decreased strength, Decreased range of motion, Decreased endurance, Decreased activity tolerance, Difficulty walking, Increased edema  Visit Diagnosis: Other abnormalities of gait and mobility  Pain in right knee  Stiffness of right knee, not elsewhere classified     Problem List Patient Active Problem List   Diagnosis Date Noted  . Status post total right knee replacement 01/04/2016    NAUMANN-HOUEGNIFIO,Chad Roth PTA 02/15/2016, 8:53 AM  [facility] [address], [place], [place], [address] Phone: [phone]   Fax:  [phone]  Name: Chad Roth MRN: [id number] Date of Birth: [birth year]

## 2016-02-15 NOTE — Patient Instructions (Signed)
   Step-Down / Step-Up    Step up on stair step with Right leg.  Repeat  10____ times per set. Do _2-3___ sets per session. Do _2-3___ sessions per day.  http://orth.exer.us/684   Copyright  VHI. All rights reserved.  Anterior Step-Down    Stand with both feet on _step.  Tapping down with left leg  While you control right leg on stairs Repeat 10 x  Do 2-3__ sets _ 2-3__ times per day.  http://gglj.exer.us/185   Copyright  VHI. All rights reserved.  Lateral Step-Up    Step up with Right leg, foot facing forward,  Repeat x 10   Do 2-3  sets  2-3___ times per day.  http://gglj.exer.us/167      Keep your knee in a straightened position during the stretch.  Hold 20 or 30 seconds, perform 3 repetitions.  3 times a day.  Leg Extension (Hamstring)   Sit toward front edge of chair, with leg out straight, heel on floor, toes pointing toward body. Keeping back straight, bend forward at hip, breathing out through pursed lips. Return, breathing in. Repeat ___ times. Repeat with other leg. Do ___ sessions per day. Variation: Perform from standing position, with support.  Hamstring Step 1   Straighten left knee. Keep knee level with other knee or on bolster. Hold ___ seconds. Relax knee by returning foot to start.       DYNAMIC Repeat ___ times.    With other leg bent, foot flat, grasp right leg and slowly try to straighten knee. Hold ____ seconds. Repeat ____ times. Do ____ sessions per day.  http://gt2.exer.us/279   Hamstring Stretch (Standing)    Perform at stairs   Standing, place one heel on chair or bench. Use one or both hands on thigh for support. Keeping torso straight, lean forward slowly until a stretch is felt in back of same thigh. Hold ____ seconds. Repeat with other leg.      Hamstring Step 3   Left leg in maximal straight leg raise, heel at maximal stretch, straighten knee further by tightening knee cap. Warning: Intense stretch. Stay within  tolerance. Hold ___ seconds. Relax knee cap only. Repeat ___ times.   Hamstring Stretch, Reclined (Strap, Doorframe)   Lengthen bottom leg on floor. Extend top leg along edge of doorframe or press foot up into yoga strap. Hold for ____ breaths. Repeat ____ times each leg.  Stretching: Hamstring (Sitting)   With right leg straight, tuck other foot near groin. Reach down until stretch is felt in back of thigh. Keep back straight. Hold ____ seconds. Repeat ____ times per set. Do ____ sets per session. Do ____ sessions per day.  http://orth.exer.us/661       Copyright  VHI. All rights reserved.  [facility] [address], [place], [place] [address] Phone # [phone] Fax [phone]

## 2016-02-20 ENCOUNTER — Ambulatory Visit: Payer: Medicaid Other | Admitting: Physical Therapy

## 2016-02-20 DIAGNOSIS — M25561 Pain in right knee: Secondary | ICD-10-CM

## 2016-02-20 DIAGNOSIS — M25661 Stiffness of right knee, not elsewhere classified: Secondary | ICD-10-CM

## 2016-02-20 DIAGNOSIS — R2689 Other abnormalities of gait and mobility: Secondary | ICD-10-CM

## 2016-02-20 NOTE — Therapy (Addendum)
[facility] [address] [place], [place], [address] Phone: [phone]   Fax:  [phone]  Physical Therapy Treatment  Patient Details  Name: Chad Roth MRN: [id number] Date of Birth: [birth year] Referring Provider: Dr. Rodell Perna  Encounter Date: 02/20/2016      PT End of Session - 02/20/16 1121    Visit Number 3   Date for PT Re-Evaluation 02/28/16   Authorization Type Medicaid   PT Start Time 1106   PT Stop Time 1202   PT Time Calculation (min) 56 min   Activity Tolerance Patient tolerated treatment well   Behavior During Therapy [facility] for tasks assessed/performed      Past Medical History  Diagnosis Date  . GERD (gastroesophageal reflux disease)   . Arthritis     Past Surgical History  Procedure Laterality Date  . Leg surgery      RIGHT  . Bilateral carpal tunnel release    . Knee arthroplasty Right 01/04/2016    Procedure: COMPUTER ASSISTED TOTAL KNEE ARTHROPLASTY;  Surgeon: Marybelle Killings, MD;  Location: [place];  Service: Orthopedics;  Laterality: Right;    There were no vitals filed for this visit.                       [place] Adult PT Treatment/Exercise - 02/20/16 0001    Ambulation/Gait   Stairs Yes   Stair Management Technique Two rails   Number of Stairs 8   Height of Stairs 6   Self-Care   Self-Care --  re-enforced importance of compliance with HEP, pt agres    Exercises   Exercises Knee/Hip   Knee/Hip Exercises: Stretches   Active Hamstring Stretch Right;3 reps;20 seconds  on stairs   Quad Stretch Right;3 reps;20 seconds  on stairs   Knee/Hip Exercises: Aerobic   Stationary Bike L 1 x 10 initially with compensatory movement due to decreased ROM Rt knee   Knee/Hip Exercises: Machines for Strengthening   Cybex Leg Press 80# 2 x 10, 85# 1 x 10   Knee/Hip Exercises: Standing   Rebounder 3 directions x 1 min with B UE support   Modalities   Modalities Vasopneumatic   Vasopneumatic   Number Minutes Vasopneumatic  15 minutes   Vasopnuematic Location  Knee   Vasopneumatic Pressure High   Vasopneumatic Temperature  3 snowflakes                     PT Long Term Goals - 02/20/16 1132    PT LONG TERM GOAL #1   Title independent with HEP   Baseline not educated yet   Time 5   Period Weeks   Status On-going   PT LONG TERM GOAL #2   Title bend right knee >/= 110 AROM to go up and down steps with greater ease   Baseline 95 degrees flexion AROM   Time 5   Period Weeks   Status On-going   PT LONG TERM GOAL #3   Title extend right knee AROM >/= -5 degrees to imporve fluid movement of gait   Baseline -15 degrees   Time 5   Period Weeks   Status On-going   PT LONG TERM GOAL #4   Title right knee strength >/= 4/5 so he can get out of the car with greater ease   Baseline right knee strength is 3+/5   Time 5   Period Weeks   Status On-going  Plan - 02/20/16 1122    Clinical Impression Statement Patinet is a 59 year old male after Rt TKA on 01/04/2016. Patient with limited ROM in Rt knee and swelling and slighly increased temperature not to be concerned about. Incision is healing well. patient will benefit from PT to reduce pain increase  ROM and strength.    Rehab Potential Excellent   Clinical Impairments Affecting Rehab Potential None   PT Frequency 1x / week   PT Treatment/Interventions Cryotherapy;Electrical Stimulation;Gait training;Stair training;Functional mobility training;Therapeutic activities;Therapeutic exercise;Patient/family education;Neuromuscular re-education;Manual techniques;Vasopneumatic Device;Passive range of motion   PT Next Visit Plan Re-enforce importance of HEP to continue to improve due to coming 1 time per week, only one more visit due to medicaid   PT Home Exercise Plan weight shift exericise, step ups, knee theraband HEP   Consulted and Agree with Plan of Care Patient      Patient will  benefit from skilled therapeutic intervention in order to improve the following deficits and impairments:  Abnormal gait, Pain, Increased fascial restricitons, Decreased scar mobility, Decreased mobility, Increased muscle spasms, Decreased strength, Decreased range of motion, Decreased endurance, Decreased activity tolerance, Difficulty walking, Increased edema  Visit Diagnosis: Other abnormalities of gait and mobility  Pain in right knee  Stiffness of right knee, not elsewhere classified     Problem List Patient Active Problem List   Diagnosis Date Noted  . Status post total right knee replacement 01/04/2016    NAUMANN-HOUEGNIFIO,ELKE PTA 02/20/2016, 4:01 PM PHYSICAL THERAPY DISCHARGE SUMMARY  Visits from Start of Care: 3  Current functional level related to goals / functional outcomes: Pt attended 3 PT sessions and didn't show for his final PT session.  Plan of care and approved Medicaid visit have expired.    Remaining deficits: See above for most current status.  Pt has HEP to address strength and AROM gains.     Education / Equipment: HEP Plan: Patient agrees to discharge.  Patient goals were partially met. Patient is being discharged due to financial reasons.  ????and not returning for final PT session.   Kelly Takacs, PT 03/07/2016 11:33 AM   [facility] [address], [address] [place], [place], [address] Phone: [phone]   Fax:  [phone]  Name: Chad Roth MRN: [id number] Date of Birth: 09/16/1955     

## 2016-02-27 ENCOUNTER — Ambulatory Visit: Payer: Medicaid Other

## 2016-03-07 ENCOUNTER — Ambulatory Visit: Payer: Medicaid Other | Attending: Orthopaedic Surgery

## 2016-03-21 NOTE — Progress Notes (Signed)
Left voice message with Chad Roth to have MD enter orders.

## 2016-03-22 NOTE — Pre-Procedure Instructions (Signed)
Chad Roth  03/22/2016      [id number] - [place], [place]. [place] [address] Phone: [phone] Fax: [phone]    Your procedure is scheduled on Mon, July 31 @ 12:30 PM  Report to [facility] Admitting at 10:30 AM  Call this number if you have problems the morning of surgery:  [phone]   Remember:  Do not eat food or drink liquids after midnight.  Take these medicines the morning of surgery with A SIP OF WATER Allopurinol(Zyloprim),Omeprazole(Prilosec),and Pain Pill(if needed)   Do not wear jewelry.  Do not wear lotions, powders, or colognes.   Men may shave face and neck.  Do not bring valuables to the hospital.  [facility] is not responsible for any belongings or valuables.  Contacts, dentures or bridgework may not be worn into surgery.  Leave your suitcase in the car.  After surgery it may be brought to your room.  For patients admitted to the hospital, discharge time will be determined by your treatment team.  Patients discharged the day of surgery will not be allowed to drive home.    [facility]  Before surgery, you can play an important role.  Because skin is not sterile, your skin needs to be as free of germs as possible.  You can reduce the number of germs on you skin by washing with CHG (chlorahexidine gluconate) soap before surgery.  CHG is an antiseptic cleaner which kills germs and bonds with the skin to continue killing germs even after washing.  Please DO NOT use if you have an allergy to CHG or antibacterial soaps.  If your skin becomes reddened/irritated stop using the CHG and inform your nurse when you arrive at Short Stay.  Do not shave (including legs and underarms) for at least 48 hours prior to the first CHG shower.  You may shave your face.  Please follow these instructions carefully:   1.  Shower with CHG Soap the  night before surgery and the                                morning of Surgery.  2.  If you choose to wash your hair, wash your hair first as usual with your       normal shampoo.  3.  After you shampoo, rinse your hair and body thoroughly to remove the                      Shampoo.  4.  Use CHG as you would any other liquid soap.  You can apply chg directly       to the skin and wash gently with scrungie or a clean washcloth.  5.  Apply the CHG Soap to your body ONLY FROM THE NECK DOWN.        Do not use on open wounds or open sores.  Avoid contact with your eyes,       ears, mouth and genitals (private parts).  Wash genitals (private parts)       with your normal soap.  6.  Wash thoroughly, paying special attention to the area where your surgery        will be performed.  7.  Thoroughly rinse your body with warm water from the neck down.  8.  DO NOT shower/wash with  your normal soap after using and rinsing off       the CHG Soap.  9.  Pat yourself dry with a clean towel.            10.  Wear clean pajamas.            11.  Place clean sheets on your bed the night of your first shower and do not        sleep with pets.  Day of Surgery  Do not apply any lotions/deoderants the morning of surgery.  Please wear clean clothes to the hospital/surgery center.     Please read over the following fact sheets that you were given. Pain Booklet

## 2016-03-23 ENCOUNTER — Encounter (HOSPITAL_COMMUNITY): Payer: Self-pay

## 2016-03-23 ENCOUNTER — Encounter (HOSPITAL_COMMUNITY)
Admission: RE | Admit: 2016-03-23 | Discharge: 2016-03-23 | Disposition: A | Discharge status: Home / Self Care | Payer: Medicaid Other | Source: Ambulatory Visit | Attending: Orthopaedic Surgery | Admitting: Orthopaedic Surgery

## 2016-03-23 DIAGNOSIS — M1712 Unilateral primary osteoarthritis, left knee: Secondary | ICD-10-CM | POA: Diagnosis not present

## 2016-03-23 DIAGNOSIS — Z01818 Encounter for other preprocedural examination: Secondary | ICD-10-CM | POA: Insufficient documentation

## 2016-03-23 DIAGNOSIS — Z01812 Encounter for preprocedural laboratory examination: Secondary | ICD-10-CM | POA: Insufficient documentation

## 2016-03-23 HISTORY — DX: Gout, unspecified: M10.9

## 2016-03-23 HISTORY — DX: Attention-deficit hyperactivity disorder, unspecified type: F90.9

## 2016-03-23 LAB — SURGICAL PCR SCREEN
MRSA, PCR: NEGATIVE
STAPHYLOCOCCUS AUREUS: NEGATIVE

## 2016-03-23 LAB — BASIC METABOLIC PANEL
ANION GAP: 7 (ref 5–15)
BUN: 9 mg/dL (ref 6–20)
CO2: 25 mmol/L (ref 22–32)
Calcium: 9.3 mg/dL (ref 8.9–10.3)
Chloride: 106 mmol/L (ref 101–111)
Creatinine, Ser: 0.92 mg/dL (ref 0.61–1.24)
GFR calc Af Amer: >60 mL/min (ref >60)
GLUCOSE: 81 mg/dL (ref 65–99)
POTASSIUM: 3.9 mmol/L (ref 3.5–5.1)
Sodium: 138 mmol/L (ref 135–145)

## 2016-03-23 LAB — CBC
HEMATOCRIT: 46 % (ref 39.0–52.0)
Hemoglobin: 15.1 g/dL (ref 13.0–17.0)
MCH: 29.8 pg (ref 26.0–34.0)
MCHC: 32.8 g/dL (ref 30.0–36.0)
MCV: 90.7 fL (ref 78.0–100.0)
Platelets: 260 10*3/uL (ref 150–400)
RBC: 5.07 MIL/uL (ref 4.22–5.81)
RDW: 13.9 % (ref 11.5–15.5)
WBC: 6.4 10*3/uL (ref 4.0–10.5)

## 2016-03-30 NOTE — H&P (Signed)
TOTAL KNEE ADMISSION H&P  Patient is being admitted for left total knee arthroplasty.  Primary left knee OA.  Subjective:  Chief Complaint:left knee pain.  HPI: Chad Roth, 60 y.o. male, has a history of pain and functional disability in the left knee due to arthritis and has failed non-surgical conservative treatments for greater than 12 weeks to includeNSAID's and/or analgesics, corticosteriod injections, use of assistive devices and activity modification.  Onset of symptoms was gradual, starting 10 years ago with gradually worsening course since that time. Patient currently rates pain in the left knee(s) at 10 out of 10 with activity. Patient has night pain, worsening of pain with activity and weight bearing, pain that interferes with activities of daily living, crepitus and joint swelling.  Patient has evidence of subchondral sclerosis, periarticular osteophytes and joint space narrowing by imaging studies.There is no active infection.  Patient Active Problem List   Diagnosis Date Noted  . 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 ADHD (attention deficit hyperactivity disorder)   . Arthritis   . GERD (gastroesophageal reflux disease)   . Gout     Past Surgical History:  Procedure Laterality Date  . BILATERAL CARPAL TUNNEL RELEASE    . JOINT REPLACEMENT    . KNEE ARTHROPLASTY Right 01/04/2016   Procedure: COMPUTER ASSISTED TOTAL KNEE ARTHROPLASTY;  Surgeon: Marybelle Killings, MD;  Location: [facility];  Service: Orthopedics;  Laterality: Right;  . LEG SURGERY     RIGHT    No prescriptions prior to admission.   Allergies  Allergen Reactions  . No Known Allergies     Social History  Substance Use Topics  . Smoking status: Never Smoker  . Smokeless tobacco: Never Used  . Alcohol use Yes     Comment: BEER   OCC    No family history on file.   Review of Systems  Constitutional: Negative.   HENT: Negative.   Eyes: Negative.    Respiratory: Negative.   Cardiovascular: Negative.   Genitourinary: Negative.   Musculoskeletal: Positive for joint pain.  Skin: Negative.   Neurological: Negative.   Psychiatric/Behavioral: Negative.     Objective:  Physical Exam  Constitutional: He is oriented to person, place, and time. He appears well-developed. No distress.  HENT:  Head: Atraumatic.  Eyes: EOM are normal.  Neck: Normal range of motion.  Cardiovascular: Normal rate.   Respiratory: Effort normal. No respiratory distress.  GI: He exhibits no distension.  Musculoskeletal: He exhibits tenderness.  Neurological: He is alert and oriented to person, place, and time.  Skin: Skin is warm and dry.  Psychiatric: He has a normal mood and affect.  Negative log roll bilateral hips.  Negative straight leg raise.  Left knee has fairly good range of motion.  He has some swelling without significant palpable effusion.  Joint line tender.  Positive crepitus.  Vital signs in last 24 hours:    Labs:   Estimated body mass index is 26.23 kg/m as calculated from the following:   Height as of 03/23/16: 5\' 10"  (1.778 m).   Weight as of 03/23/16: 82.9 kg (182 lb 12.8 oz).   Imaging Review Plain radiographs demonstrate moderate degenerative joint disease of the left knee(s). The overall alignment ismild varus. The bone quality appears to be good for age and reported activity level.  Assessment/Plan:  End stage arthritis, left knee   The patient history, physical examination, clinical judgment of the provider and imaging studies  are consistent with end stage degenerative joint disease of the left knee(s) and total knee arthroplasty is deemed medically necessary. The treatment options including medical management, injection therapy arthroscopy and arthroplasty were discussed at length. The risks and benefits of total knee arthroplasty were presented and reviewed. The risks due to aseptic loosening, infection, stiffness, patella  tracking problems, thromboembolic complications and other imponderables were discussed. The patient acknowledged the explanation, agreed to proceed with the plan and consent was signed. Patient is being admitted for inpatient treatment for surgery, pain control, PT, OT, prophylactic antibiotics, VTE prophylaxis, progressive ambulation and ADL's and discharge planning. The patient is planning to be discharged home with home health services

## 2016-04-01 MED ORDER — CEFAZOLIN SODIUM-DEXTROSE 2-4 GM/100ML-% IV SOLN
2.0000 g | INTRAVENOUS | Status: AC
  Administered 2016-04-02: 2 g via INTRAVENOUS
  Filled 2016-04-01: qty 100

## 2016-04-01 MED ORDER — CHLORHEXIDINE GLUCONATE 4 % EX LIQD: 60.0000 mL | Freq: Once | CUTANEOUS | Status: DC

## 2016-04-02 ENCOUNTER — Encounter (HOSPITAL_COMMUNITY): Payer: Self-pay | Admitting: Surgery

## 2016-04-02 ENCOUNTER — Inpatient Hospital Stay (HOSPITAL_COMMUNITY)
Admission: RE | Admit: 2016-04-02 | Discharge: 2016-04-04 | DRG: 470 | Disposition: A | Discharge status: Home Health | Payer: Medicaid Other | Source: Ambulatory Visit | Attending: Orthopaedic Surgery | Admitting: Orthopaedic Surgery

## 2016-04-02 ENCOUNTER — Inpatient Hospital Stay (HOSPITAL_COMMUNITY): Payer: Medicaid Other | Admitting: Anesthesiology

## 2016-04-02 ENCOUNTER — Encounter (HOSPITAL_COMMUNITY)
Admission: RE | Disposition: A | Discharge status: Home Health | Payer: Self-pay | Source: Ambulatory Visit | Attending: Orthopaedic Surgery

## 2016-04-02 DIAGNOSIS — K219 Gastro-esophageal reflux disease without esophagitis: Secondary | ICD-10-CM | POA: Diagnosis present

## 2016-04-02 DIAGNOSIS — M109 Gout, unspecified: Secondary | ICD-10-CM | POA: Diagnosis present

## 2016-04-02 DIAGNOSIS — Z7982 Long term (current) use of aspirin: Secondary | ICD-10-CM | POA: Diagnosis not present

## 2016-04-02 DIAGNOSIS — F909 Attention-deficit hyperactivity disorder, unspecified type: Secondary | ICD-10-CM | POA: Diagnosis present

## 2016-04-02 DIAGNOSIS — M25562 Pain in left knee: Secondary | ICD-10-CM | POA: Diagnosis present

## 2016-04-02 DIAGNOSIS — M1712 Unilateral primary osteoarthritis, left knee: Principal | ICD-10-CM | POA: Diagnosis present

## 2016-04-02 HISTORY — PX: KNEE ARTHROPLASTY: SHX992

## 2016-04-02 LAB — HEPATIC FUNCTION PANEL
ALBUMIN: 3.6 g/dL (ref 3.5–5.0)
ALK PHOS: 80 U/L (ref 38–126)
ALT: 17 U/L (ref 17–63)
AST: 34 U/L (ref 15–41)
BILIRUBIN TOTAL: 0.4 mg/dL (ref 0.3–1.2)
Bilirubin, Direct: <0.1 mg/dL — ABNORMAL LOW (ref 0.1–0.5)
TOTAL PROTEIN: 6.2 g/dL — AB (ref 6.5–8.1)

## 2016-04-02 LAB — APTT: aPTT: 34 seconds (ref 24–36)

## 2016-04-02 LAB — PROTIME-INR
INR: 1.09
PROTHROMBIN TIME: 14.1 s (ref 11.4–15.2)

## 2016-04-02 SURGERY — ARTHROPLASTY, KNEE, TOTAL, USING IMAGELESS COMPUTER-ASSISTED NAVIGATION
Anesthesia: General | Laterality: Left

## 2016-04-02 MED ORDER — OXYCODONE HCL 5 MG PO TABS
ORAL_TABLET | ORAL | Status: AC
  Filled 2016-04-02: qty 2

## 2016-04-02 MED ORDER — METOCLOPRAMIDE HCL 5 MG/ML IJ SOLN: 5.0000 mg | Freq: Three times a day (TID) | INTRAMUSCULAR | Status: DC | PRN

## 2016-04-02 MED ORDER — HYDROMORPHONE HCL 1 MG/ML IJ SOLN
0.2500 mg | INTRAMUSCULAR | Status: DC | PRN
  Administered 2016-04-02 (×2): 0.5 mg via INTRAVENOUS

## 2016-04-02 MED ORDER — METOCLOPRAMIDE HCL 5 MG PO TABS: 5.0000 mg | ORAL_TABLET | Freq: Three times a day (TID) | ORAL | Status: DC | PRN

## 2016-04-02 MED ORDER — ONDANSETRON HCL 4 MG PO TABS: 4.0000 mg | ORAL_TABLET | Freq: Four times a day (QID) | ORAL | Status: DC | PRN

## 2016-04-02 MED ORDER — LIDOCAINE 2% (20 MG/ML) 5 ML SYRINGE
INTRAMUSCULAR | Status: AC
  Filled 2016-04-02: qty 10

## 2016-04-02 MED ORDER — FENTANYL CITRATE (PF) 250 MCG/5ML IJ SOLN
INTRAMUSCULAR | Status: AC
  Filled 2016-04-02: qty 5

## 2016-04-02 MED ORDER — DEXTROSE 5 % IV SOLN: 500.0000 mg | Freq: Four times a day (QID) | INTRAVENOUS | Status: DC | PRN

## 2016-04-02 MED ORDER — DEXTROSE 5 % IV SOLN
INTRAVENOUS | Status: DC | PRN
  Administered 2016-04-02: 20 ug/min via INTRAVENOUS

## 2016-04-02 MED ORDER — BISACODYL 10 MG RE SUPP: 10.0000 mg | Freq: Every day | RECTAL | Status: DC | PRN

## 2016-04-02 MED ORDER — DEXAMETHASONE SODIUM PHOSPHATE 10 MG/ML IJ SOLN
INTRAMUSCULAR | Status: AC
  Filled 2016-04-02: qty 1

## 2016-04-02 MED ORDER — SUGAMMADEX SODIUM 200 MG/2ML IV SOLN
INTRAVENOUS | Status: DC | PRN
  Administered 2016-04-02: 200 mg via INTRAVENOUS

## 2016-04-02 MED ORDER — PROPOFOL 10 MG/ML IV BOLUS
INTRAVENOUS | Status: DC | PRN
  Administered 2016-04-02: 150 mg via INTRAVENOUS

## 2016-04-02 MED ORDER — HYDROMORPHONE HCL 1 MG/ML IJ SOLN
1.0000 mg | INTRAMUSCULAR | Status: DC | PRN
  Administered 2016-04-03 – 2016-04-04 (×3): 1 mg via INTRAVENOUS
  Filled 2016-04-02 (×3): qty 1

## 2016-04-02 MED ORDER — ACETAMINOPHEN 325 MG PO TABS
650.0000 mg | ORAL_TABLET | Freq: Four times a day (QID) | ORAL | Status: DC | PRN
  Administered 2016-04-04: 650 mg via ORAL
  Filled 2016-04-02: qty 2

## 2016-04-02 MED ORDER — PROPOFOL 10 MG/ML IV BOLUS
INTRAVENOUS | Status: AC
  Filled 2016-04-02: qty 40

## 2016-04-02 MED ORDER — BUPIVACAINE HCL (PF) 0.25 % IJ SOLN
INTRAMUSCULAR | Status: DC | PRN
  Administered 2016-04-02: 30 mL

## 2016-04-02 MED ORDER — PANTOPRAZOLE SODIUM 40 MG PO TBEC
40.0000 mg | DELAYED_RELEASE_TABLET | Freq: Every day | ORAL | Status: DC
  Administered 2016-04-02 – 2016-04-04 (×3): 40 mg via ORAL
  Filled 2016-04-02 (×3): qty 1

## 2016-04-02 MED ORDER — SUGAMMADEX SODIUM 200 MG/2ML IV SOLN
INTRAVENOUS | Status: AC
  Filled 2016-04-02: qty 2

## 2016-04-02 MED ORDER — METHOCARBAMOL 500 MG PO TABS
500.0000 mg | ORAL_TABLET | Freq: Four times a day (QID) | ORAL | Status: DC | PRN
  Administered 2016-04-02 – 2016-04-04 (×4): 500 mg via ORAL
  Filled 2016-04-02 (×4): qty 1

## 2016-04-02 MED ORDER — SODIUM CHLORIDE 0.9 % IR SOLN
Status: DC | PRN
  Administered 2016-04-02: 3000 mL

## 2016-04-02 MED ORDER — PHENYLEPHRINE HCL 10 MG/ML IJ SOLN
INTRAMUSCULAR | Status: DC | PRN
  Administered 2016-04-02: 80 ug via INTRAVENOUS

## 2016-04-02 MED ORDER — MIDAZOLAM HCL 5 MG/5ML IJ SOLN
INTRAMUSCULAR | Status: DC | PRN
  Administered 2016-04-02 (×2): 1 mg via INTRAVENOUS

## 2016-04-02 MED ORDER — MENTHOL 3 MG MT LOZG: 1.0000 | LOZENGE | OROMUCOSAL | Status: DC | PRN

## 2016-04-02 MED ORDER — ONDANSETRON HCL 4 MG/2ML IJ SOLN
INTRAMUSCULAR | Status: AC
  Filled 2016-04-02: qty 2

## 2016-04-02 MED ORDER — ROCURONIUM BROMIDE 50 MG/5ML IV SOLN
INTRAVENOUS | Status: AC
  Filled 2016-04-02: qty 1

## 2016-04-02 MED ORDER — DEXAMETHASONE SODIUM PHOSPHATE 10 MG/ML IJ SOLN
INTRAMUSCULAR | Status: DC | PRN
  Administered 2016-04-02: 10 mg via INTRAVENOUS

## 2016-04-02 MED ORDER — LIDOCAINE 2% (20 MG/ML) 5 ML SYRINGE
INTRAMUSCULAR | Status: DC | PRN
  Administered 2016-04-02: 60 mg via INTRAVENOUS

## 2016-04-02 MED ORDER — BUPIVACAINE-EPINEPHRINE (PF) 0.5% -1:200000 IJ SOLN
INTRAMUSCULAR | Status: DC | PRN
  Administered 2016-04-02: 20 mL via PERINEURAL

## 2016-04-02 MED ORDER — ACETAMINOPHEN 650 MG RE SUPP
650.0000 mg | Freq: Four times a day (QID) | RECTAL | Status: DC | PRN
Start: 2016-04-02 — End: 2016-04-04

## 2016-04-02 MED ORDER — MIDAZOLAM HCL 2 MG/2ML IJ SOLN
INTRAMUSCULAR | Status: AC
  Administered 2016-04-02: 2 mg
  Filled 2016-04-02: qty 2

## 2016-04-02 MED ORDER — SODIUM CHLORIDE 0.45 % IV SOLN
INTRAVENOUS | Status: DC
  Administered 2016-04-02: 18:00:00 via INTRAVENOUS

## 2016-04-02 MED ORDER — POLYETHYLENE GLYCOL 3350 17 G PO PACK
17.0000 g | PACK | Freq: Every day | ORAL | Status: DC | PRN
  Administered 2016-04-03: 17 g via ORAL
  Filled 2016-04-02: qty 1

## 2016-04-02 MED ORDER — FENTANYL CITRATE (PF) 100 MCG/2ML IJ SOLN
INTRAMUSCULAR | Status: AC
  Administered 2016-04-02: 100 ug
  Filled 2016-04-02: qty 2

## 2016-04-02 MED ORDER — ASPIRIN EC 325 MG PO TBEC
325.0000 mg | DELAYED_RELEASE_TABLET | Freq: Every day | ORAL | Status: DC
  Administered 2016-04-03 – 2016-04-04 (×2): 325 mg via ORAL
  Filled 2016-04-02 (×2): qty 1

## 2016-04-02 MED ORDER — MIDAZOLAM HCL 2 MG/2ML IJ SOLN
INTRAMUSCULAR | Status: AC
  Filled 2016-04-02: qty 2

## 2016-04-02 MED ORDER — PHENOL 1.4 % MT LIQD: 1.0000 | OROMUCOSAL | Status: DC | PRN

## 2016-04-02 MED ORDER — CEFAZOLIN IN D5W 1 GM/50ML IV SOLN
1.0000 g | Freq: Three times a day (TID) | INTRAVENOUS | Status: AC
  Administered 2016-04-02 – 2016-04-03 (×2): 1 g via INTRAVENOUS
  Filled 2016-04-02 (×2): qty 50

## 2016-04-02 MED ORDER — HYDROMORPHONE HCL 1 MG/ML IJ SOLN
INTRAMUSCULAR | Status: AC
  Filled 2016-04-02: qty 1

## 2016-04-02 MED ORDER — ROCURONIUM BROMIDE 100 MG/10ML IV SOLN
INTRAVENOUS | Status: DC | PRN
  Administered 2016-04-02: 20 mg via INTRAVENOUS
  Administered 2016-04-02: 40 mg via INTRAVENOUS
  Administered 2016-04-02: 10 mg via INTRAVENOUS

## 2016-04-02 MED ORDER — ONDANSETRON HCL 4 MG/2ML IJ SOLN: 4.0000 mg | Freq: Four times a day (QID) | INTRAMUSCULAR | Status: DC | PRN

## 2016-04-02 MED ORDER — FENTANYL CITRATE (PF) 250 MCG/5ML IJ SOLN
INTRAMUSCULAR | Status: DC | PRN
  Administered 2016-04-02 (×4): 50 ug via INTRAVENOUS

## 2016-04-02 MED ORDER — METHOCARBAMOL 500 MG PO TABS
ORAL_TABLET | ORAL | Status: AC
  Filled 2016-04-02: qty 1

## 2016-04-02 MED ORDER — DOCUSATE SODIUM 100 MG PO CAPS
100.0000 mg | ORAL_CAPSULE | Freq: Two times a day (BID) | ORAL | Status: DC
  Administered 2016-04-03 – 2016-04-04 (×4): 100 mg via ORAL
  Filled 2016-04-02 (×4): qty 1

## 2016-04-02 MED ORDER — LACTATED RINGERS IV SOLN
INTRAVENOUS | Status: DC
  Administered 2016-04-02 (×2): via INTRAVENOUS

## 2016-04-02 MED ORDER — BUPIVACAINE HCL (PF) 0.25 % IJ SOLN
INTRAMUSCULAR | Status: AC
  Filled 2016-04-02: qty 30

## 2016-04-02 MED ORDER — ALLOPURINOL 300 MG PO TABS
300.0000 mg | ORAL_TABLET | Freq: Every day | ORAL | Status: DC
  Administered 2016-04-02 – 2016-04-04 (×3): 300 mg via ORAL
  Filled 2016-04-02 (×3): qty 1

## 2016-04-02 MED ORDER — OXYCODONE HCL 5 MG PO TABS
5.0000 mg | ORAL_TABLET | ORAL | Status: DC | PRN
  Administered 2016-04-02 – 2016-04-04 (×10): 10 mg via ORAL
  Filled 2016-04-02 (×9): qty 2

## 2016-04-02 MED ORDER — 0.9 % SODIUM CHLORIDE (POUR BTL) OPTIME
TOPICAL | Status: DC | PRN
  Administered 2016-04-02: 1000 mL

## 2016-04-02 MED ORDER — PROMETHAZINE HCL 25 MG/ML IJ SOLN: 6.2500 mg | INTRAMUSCULAR | Status: DC | PRN

## 2016-04-02 MED ORDER — AMPHETAMINE-DEXTROAMPHETAMINE 10 MG PO TABS: 20.0000 mg | ORAL_TABLET | Freq: Two times a day (BID) | ORAL | Status: DC | PRN

## 2016-04-02 SURGICAL SUPPLY — 65 items
BANDAGE ELASTIC 4 VELCRO ST LF (GAUZE/BANDAGES/DRESSINGS) ×2 IMPLANT
BANDAGE ESMARK 6X9 LF (GAUZE/BANDAGES/DRESSINGS) ×1 IMPLANT
BENZOIN TINCTURE PRP APPL 2/3 (GAUZE/BANDAGES/DRESSINGS) ×2 IMPLANT
BLADE SAGITTAL 25.0X1.19X90 (BLADE) ×2 IMPLANT
BLADE SAW SGTL 13X75X1.27 (BLADE) ×2 IMPLANT
BLADE SURG 15 STRL LF DISP TIS (BLADE) ×1 IMPLANT
BLADE SURG 15 STRL SS (BLADE) ×1
BNDG ELASTIC 6X10 VLCR STRL LF (GAUZE/BANDAGES/DRESSINGS) ×2 IMPLANT
BNDG ESMARK 6X9 LF (GAUZE/BANDAGES/DRESSINGS) ×2
BOWL SMART MIX CTS (DISPOSABLE) ×2 IMPLANT
CAP KNEE TOTAL 3 SIGMA ×2 IMPLANT
CEMENT HV SMART SET (Cement) ×4 IMPLANT
CLSR STERI-STRIP ANTIMIC 1/2X4 (GAUZE/BANDAGES/DRESSINGS) ×2 IMPLANT
COVER SURGICAL LIGHT HANDLE (MISCELLANEOUS) ×2 IMPLANT
CUFF TOURNIQUET SINGLE 34IN LL (TOURNIQUET CUFF) ×2 IMPLANT
CUFF TOURNIQUET SINGLE 44IN (TOURNIQUET CUFF) IMPLANT
DRAPE ORTHO SPLIT 77X108 STRL (DRAPES) ×2
DRAPE SURG ORHT 6 SPLT 77X108 (DRAPES) ×2 IMPLANT
DRAPE U-SHAPE 47X51 STRL (DRAPES) ×2 IMPLANT
DRSG PAD ABDOMINAL 8X10 ST (GAUZE/BANDAGES/DRESSINGS) ×2 IMPLANT
DURAPREP 26ML APPLICATOR (WOUND CARE) ×2 IMPLANT
ELECT REM PT RETURN 9FT ADLT (ELECTROSURGICAL) ×2
ELECTRODE REM PT RTRN 9FT ADLT (ELECTROSURGICAL) ×1 IMPLANT
FACESHIELD WRAPAROUND (MASK) ×4 IMPLANT
GAUZE SPONGE 4X4 12PLY STRL (GAUZE/BANDAGES/DRESSINGS) ×4 IMPLANT
GAUZE XEROFORM 1X8 LF (GAUZE/BANDAGES/DRESSINGS) ×2 IMPLANT
GAUZE XEROFORM 5X9 LF (GAUZE/BANDAGES/DRESSINGS) ×2 IMPLANT
GLOVE BIOGEL PI IND STRL 8 (GLOVE) ×2 IMPLANT
GLOVE BIOGEL PI INDICATOR 8 (GLOVE) ×2
GLOVE ORTHO TXT STRL SZ7.5 (GLOVE) ×4 IMPLANT
GOWN STRL REUS W/ TWL LRG LVL3 (GOWN DISPOSABLE) ×1 IMPLANT
GOWN STRL REUS W/ TWL XL LVL3 (GOWN DISPOSABLE) ×1 IMPLANT
GOWN STRL REUS W/TWL 2XL LVL3 (GOWN DISPOSABLE) ×2 IMPLANT
GOWN STRL REUS W/TWL LRG LVL3 (GOWN DISPOSABLE) ×1
GOWN STRL REUS W/TWL XL LVL3 (GOWN DISPOSABLE) ×1
HANDPIECE INTERPULSE COAX TIP (DISPOSABLE) ×1
KIT BASIN OR (CUSTOM PROCEDURE TRAY) ×2 IMPLANT
KIT ROOM TURNOVER OR (KITS) ×2 IMPLANT
MANIFOLD NEPTUNE II (INSTRUMENTS) ×2 IMPLANT
MARKER SPHERE PSV REFLC THRD 5 (MARKER) ×8 IMPLANT
NEEDLE HYPO 25GX1X1/2 BEV (NEEDLE) ×2 IMPLANT
NS IRRIG 1000ML POUR BTL (IV SOLUTION) ×2 IMPLANT
PACK TOTAL JOINT (CUSTOM PROCEDURE TRAY) ×2 IMPLANT
PACK UNIVERSAL I (CUSTOM PROCEDURE TRAY) ×2 IMPLANT
PAD ARMBOARD 7.5X6 YLW CONV (MISCELLANEOUS) ×4 IMPLANT
PAD CAST 4YDX4 CTTN HI CHSV (CAST SUPPLIES) ×1 IMPLANT
PADDING CAST COTTON 4X4 STRL (CAST SUPPLIES) ×1
PADDING CAST COTTON 6X4 STRL (CAST SUPPLIES) ×4 IMPLANT
PIN SCHANZ 4MM 130MM (PIN) ×8 IMPLANT
SET HNDPC FAN SPRY TIP SCT (DISPOSABLE) ×1 IMPLANT
SPONGE GAUZE 4X4 12PLY STER LF (GAUZE/BANDAGES/DRESSINGS) ×2 IMPLANT
STAPLER VISISTAT 35W (STAPLE) IMPLANT
SUCTION FRAZIER HANDLE 10FR (MISCELLANEOUS) ×1
SUCTION TUBE FRAZIER 10FR DISP (MISCELLANEOUS) ×1 IMPLANT
SUT VIC AB 0 CT1 27 (SUTURE) ×1
SUT VIC AB 0 CT1 27XBRD ANBCTR (SUTURE) ×1 IMPLANT
SUT VIC AB 1 CTX 36 (SUTURE) ×2
SUT VIC AB 1 CTX36XBRD ANBCTR (SUTURE) ×2 IMPLANT
SUT VIC AB 2-0 CT1 27 (SUTURE) ×2
SUT VIC AB 2-0 CT1 TAPERPNT 27 (SUTURE) ×2 IMPLANT
SUT VIC AB 3-0 X1 27 (SUTURE) ×4 IMPLANT
SYR CONTROL 10ML LL (SYRINGE) ×2 IMPLANT
TOWEL OR 17X24 6PK STRL BLUE (TOWEL DISPOSABLE) ×2 IMPLANT
TOWEL OR 17X26 10 PK STRL BLUE (TOWEL DISPOSABLE) ×2 IMPLANT
WATER STERILE IRR 1000ML POUR (IV SOLUTION) ×2 IMPLANT

## 2016-04-02 NOTE — Anesthesia Procedure Notes (Signed)
Anesthesia Regional Block:  Adductor canal block  Pre-Anesthetic Checklist: ,, timeout performed, Correct Patient, Correct Site, Correct Laterality, Correct Procedure, Correct Position, site marked, Risks and benefits discussed,  Surgical consent,  Pre-op evaluation,  At surgeon's request and post-op pain management  Laterality: Left  Prep: chloraprep       Needles:  Injection technique: Single-shot  Needle Type: Echogenic Needle     Needle Length: 9cm 9 cm Needle Gauge: 21 and 21 G    Additional Needles:  Procedures: ultrasound guided (picture in chart) Adductor canal block Narrative:  Start time: 04/02/2016 11:48 AM End time: 04/02/2016 11:55 AM Injection made incrementally with aspirations every 5 mL.  Performed by: Personally  Anesthesiologist: Elliot Simoneaux  Additional Notes: Pt tolerated the procedure well.

## 2016-04-02 NOTE — Progress Notes (Signed)
Orthopedic Tech Progress Note Patient Details:  Chad Roth 1955/10/25 [id number]  CPM Left Knee CPM Left Knee: On Left Knee Flexion (Degrees): 90 Left Knee Extension (Degrees): 0   [name] 04/02/2016, 3:51 PM

## 2016-04-02 NOTE — Anesthesia Procedure Notes (Signed)
Procedure Name: Intubation Date/Time: 04/02/2016 12:13 PM Performed by: Mervyn Gay Pre-anesthesia Checklist: Patient identified, Patient being monitored, Timeout performed, Emergency Drugs available and Suction available Patient Re-evaluated:Patient Re-evaluated prior to inductionOxygen Delivery Method: Circle System Utilized Preoxygenation: Pre-oxygenation with 100% oxygen Intubation Type: IV induction Ventilation: Mask ventilation without difficulty Laryngoscope Size: Miller and 3 Grade View: Grade I Tube type: Oral Tube size: 8.0 mm Number of attempts: 1 Airway Equipment and Method: Stylet Placement Confirmation: ETT inserted through vocal cords under direct vision,  positive ETCO2 and breath sounds checked- equal and bilateral Secured at: 23 cm Tube secured with: Tape Dental Injury: Teeth and Oropharynx as per pre-operative assessment

## 2016-04-02 NOTE — Anesthesia Preprocedure Evaluation (Signed)
Anesthesia Evaluation  Patient identified by MRN, date of birth, ID band Patient awake    Reviewed: Allergy & Precautions, NPO status , Patient's Chart, lab work & pertinent test results  Airway Mallampati: II  TM Distance: >3 FB Neck ROM: Full    Dental no notable dental hx.    Pulmonary neg pulmonary ROS,    Pulmonary exam normal breath sounds clear to auscultation       Cardiovascular negative cardio ROS Normal cardiovascular exam Rhythm:Regular Rate:Normal     Neuro/Psych negative neurological ROS  negative psych ROS   GI/Hepatic Neg liver ROS, GERD  Medicated,  Endo/Other  negative endocrine ROS  Renal/GU negative Renal ROS  negative genitourinary   Musculoskeletal negative musculoskeletal ROS (+)   Abdominal   Peds negative pediatric ROS (+)  Hematology negative hematology ROS (+)   Anesthesia Other Findings   Reproductive/Obstetrics negative OB ROS                             Anesthesia Physical Anesthesia Plan  ASA: II  Anesthesia Plan: General   Post-op Pain Management: GA combined w/ Regional for post-op pain   Induction: Intravenous  Airway Management Planned: LMA and Oral ETT  Additional Equipment:   Intra-op Plan:   Post-operative Plan: Extubation in OR  Informed Consent: I have reviewed the patients History and Physical, chart, labs and discussed the procedure including the risks, benefits and alternatives for the proposed anesthesia with the patient or authorized representative who has indicated his/her understanding and acceptance.   Dental advisory given  Plan Discussed with: CRNA and Surgeon  Anesthesia Plan Comments:         Anesthesia Quick Evaluation

## 2016-04-02 NOTE — Brief Op Note (Signed)
04/02/2016  3:18 PM  PATIENT:  Chad Roth  60 y.o. male  PRE-OPERATIVE DIAGNOSIS:  Left Knee End Stage Degenerative Joint Disease  POST-OPERATIVE DIAGNOSIS:  Left Knee End Stage Degenerative Joint Disease  PROCEDURE:  Procedure(s): COMPUTER ASSISTED TOTAL KNEE ARTHROPLASTY (Left)  SURGEON:  Surgeon(s) and Role:    Marybelle Killings, MD - Primary  PHYSICIAN ASSISTANT: Maliki Gignac m. Cylinda Santoli pa-c    ANESTHESIA:   general  EBL:  Total I/O In: 1500 [I.V.:1500] Out: 100 [Blood:100]  BLOOD ADMINISTERED:none  DRAINS: none   LOCAL MEDICATIONS USED:  MARCAINE     SPECIMEN:  No Specimen  DISPOSITION OF SPECIMEN:  N/A  COUNTS:  YES  TOURNIQUET:   Total Tourniquet Time Documented: Thigh (Left) - 66 minutes Total: Thigh (Left) - 66 minutes   DICTATION: .Viviann Spare Dictation  PLAN OF CARE: Admit to inpatient   PATIENT DISPOSITION:  PACU - hemodynamically stable.

## 2016-04-02 NOTE — Transfer of Care (Signed)
Immediate Anesthesia Transfer of Care Note  Patient: Chad Roth  Procedure(s) Performed: Procedure(s): COMPUTER ASSISTED TOTAL KNEE ARTHROPLASTY (Left)  Patient Location: PACU  Anesthesia Type:GA combined with regional for post-op pain  Level of Consciousness: awake, alert , oriented and patient cooperative  Airway & Oxygen Therapy: Patient Spontanous Breathing and Patient connected to nasal cannula oxygen  Post-op Assessment: Report given to RN and Post -op Vital signs reviewed and stable  Post vital signs: Reviewed and stable  Last Vitals:  Vitals:   04/02/16 1030 04/02/16 1031  BP:  (!) 176/97  Pulse: 61   Resp: 18   Temp: 36.4 C     Last Pain:  Vitals:   04/02/16 1030  TempSrc: Oral      Patients Stated Pain Goal: 3 ([id number])  Complications: No apparent anesthesia complications

## 2016-04-02 NOTE — Progress Notes (Signed)
Orthopedic Tech Progress Note Patient Details:  Chad Roth 09-24-1955 [id number]  Patient ID: Chad Roth, male   DOB: [birth year], 60 y.o.   MRN: [id number]   [name] 04/02/2016, 3:56 PM Trapeze bar patient helper

## 2016-04-03 ENCOUNTER — Encounter (HOSPITAL_COMMUNITY): Payer: Self-pay | Admitting: Orthopaedic Surgery

## 2016-04-03 LAB — BASIC METABOLIC PANEL
ANION GAP: 4 — AB (ref 5–15)
BUN: 11 mg/dL (ref 6–20)
CALCIUM: 8.4 mg/dL — AB (ref 8.9–10.3)
CO2: 27 mmol/L (ref 22–32)
CREATININE: 0.86 mg/dL (ref 0.61–1.24)
Chloride: 105 mmol/L (ref 101–111)
GFR calc Af Amer: >60 mL/min (ref >60)
GFR calc non Af Amer: >60 mL/min (ref >60)
GLUCOSE: 139 mg/dL — AB (ref 65–99)
Potassium: 3.7 mmol/L (ref 3.5–5.1)
Sodium: 136 mmol/L (ref 135–145)

## 2016-04-03 LAB — CBC
HEMATOCRIT: 37.4 % — AB (ref 39.0–52.0)
Hemoglobin: 11.9 g/dL — ABNORMAL LOW (ref 13.0–17.0)
MCH: 28.8 pg (ref 26.0–34.0)
MCHC: 31.8 g/dL (ref 30.0–36.0)
MCV: 90.6 fL (ref 78.0–100.0)
Platelets: 253 10*3/uL (ref 150–400)
RBC: 4.13 MIL/uL — ABNORMAL LOW (ref 4.22–5.81)
RDW: 13.9 % (ref 11.5–15.5)
WBC: 10.5 10*3/uL (ref 4.0–10.5)

## 2016-04-03 NOTE — Progress Notes (Signed)
Orthopedic Tech Progress Note Patient Details:  Chad Roth 1956-07-14 [id number] Ortho visit put on cpm at 1825 Patient ID: Chad Roth, male   DOB: 1956-08-02, 60 y.o.   MRN: [id number]   [name] 04/03/2016, 6:27 PM

## 2016-04-03 NOTE — Op Note (Signed)
Chad Roth, Chad Roth NO.:  [id number]  MEDICAL RECORD NO.:  [id number]  LOCATION:  [facility]                         FACILITY:  [facility]  PHYSICIAN:  Mark C. Lorin Mercy, M.D.    DATE OF BIRTH:  [birth year]  DATE OF PROCEDURE:  04/02/2016 DATE OF DISCHARGE:                              OPERATIVE REPORT   PREOPERATIVE DIAGNOSIS:  Left knee osteoarthritis, primary osteoarthritis.  POSTOPERATIVE DIAGNOSIS:  Left knee osteoarthritis, primary osteoarthritis.  PROCEDURE:  Left total knee arthroplasty, DePuy LCS rotating platform, #4 femur, #4 tibia, 10 mm spacer, 38 mm, 3-PEG patella.  SURGEON:  Mark C. Lorin Mercy, M.D.  ASSISTANT:  Alyson Locket. Ricard Dillon, PA-C, medically necessary and present for the entire procedure.  ANESTHESIA:  General plus preoperative adductor block.  DRAINS:  None.  TOURNIQUET TIME:  1 hour 5 minutes.  PROCEDURE DESCRIPTION:  After standard prepping and draping, proximal thigh tourniquet, Ancef prophylaxis, extremity sheets and drapes. Sterile skin marker, Betadine, Steri-Drape, impervious stockinette, Coban wrap were applied.  Leg was wrapped in Esmarch, tourniquet was inflated.  Midline incision was made.  Patella was flipped over after medial parapatellar incision was made.  Patella was cut to 10 mm.  Spurs were removed, which were prominent on the medial aspect.  There were some hypertrophic changes, some loose bodies were present and bone on the medial femoral condyle.  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  Computer navigation was used for generation, sizings and alignment.  A #4 femur was selected, 9 mm was taken off the femur, 9 off the tibia. Chamfer cuts, box cuts were made.  The tibial keel cuts were prepared. There was still some tightness and additional 2 mm were taken off the distal cut of the femur followed by touchup of the chamber cuts and deep portion of the box cut.  Vacuum suction on the cement mixing was performed.  Lug  nuts were drilled on the trials for the femur as well as preparation of the patella 38 mm.  Pulse lavage, vacuum mixing of the cement, cementing the tibia followed by femur and patellar component.  A 10 mm spacer was inserted.  Knee reached full extension, good correction of the varus and valgus deformity, and the pins for computer navigation were removed. The cement was hard, a little bit slow at 17 minutes.  Tourniquet deflated.  Hemostasis was obtained and then standard closure with #1 Vicryl in the capsule and quad tendon.  A 2-0 Vicryl, subcutaneous tissue, subcuticular skin closure, postop dressing and transferred to recovery room.  Instrument count and needle count was correct.     Mark C. Lorin Mercy, M.D.     MCY/MEDQ  D:  04/02/2016  T:  04/02/2016  Job:  [id number]

## 2016-04-03 NOTE — Care Management Note (Signed)
Case Management Note  Patient Details  Name: Cottrell Lhuillier MRN: [id number] Date of Birth: 02-Dec-1955  Subjective/Objective:   60 yr old gentleman s/p left total knee arthroplasty.                 Action/Plan: Case manager spoke with patient concerning home health and DME needs. Patient states he has rolling walker and 3in1 already. States he can do exercises himself or go to outpatient. CM informed patient that we will need approval from Dr. Lorin Mercy concerning either of these options. CM will continue to follow.   Expected Discharge Date:                  Expected Discharge Plan:     In-House Referral:     Discharge planning Services     Post Acute Care Choice:    Choice offered to:     DME Arranged:   NA DME Agency:     HH Arranged:    HH Agency:     Status of Service:    In process If discussed at Long Length of Stay Meetings, dates discussed:    Additional Comments:  Ninfa Meeker, RN 04/03/2016, 2:18 PM

## 2016-04-03 NOTE — Evaluation (Signed)
Physical Therapy Evaluation Patient Details Name: Chad Roth MRN: [id number] DOB: 10-Oct-1955 Today's Date: 04/03/2016   History of Present Illness  Pt is a 60 y/o male s/p L TKA. PMH including R TKA in 01/2016.  Clinical Impression  Pt presented supine in bed with HOB elevated, awake and willing to participate in therapy session. Pt required min guard during functional mobility for safety only, no physical assistance was required. Pt did need VC'ing during ambulation for sequencing with RW. Pt would continue to benefit from skilled physical therapy services at this time while admitted and after d/c to address his below listed limitations in order to improve his overall safety and independence with functional mobility. PT plan to perform stair training at next visit.      Follow Up Recommendations Home health PT;Supervision for mobility/OOB    Equipment Recommendations  None recommended by PT;Other (comment) (pt reported having all necessary equipment at home)    Recommendations for Other Services       Precautions / Restrictions Precautions Precautions: Knee Restrictions Weight Bearing Restrictions: Yes LLE Weight Bearing: Weight bearing as tolerated      Mobility  Bed Mobility Overal bed mobility: Needs Assistance Bed Mobility: Supine to Sit     Supine to sit: Min guard;HOB elevated     General bed mobility comments: Pt required increased time to complete task.  Transfers Overall transfer level: Needs assistance Equipment used: Rolling walker (2 wheeled) Transfers: Sit to/from Stand Sit to Stand: Min guard         General transfer comment: Pt required increased time and VC'ing for bilateral hand positioning  Ambulation/Gait Ambulation/Gait assistance: Min guard Ambulation Distance (Feet): 25 Feet Assistive device: Rolling walker (2 wheeled) Gait Pattern/deviations: Step-to pattern;Decreased step length - right;Decreased stance time - left;Decreased weight shift to  left Gait velocity: decreased Gait velocity interpretation: Below normal speed for age/gender General Gait Details: Pt required VC'ing for sequencing  Stairs            Wheelchair Mobility    Modified Rankin (Stroke Patients Only)       Balance Overall balance assessment: Needs assistance Sitting-balance support: No upper extremity supported;Feet supported Sitting balance-Leahy Scale: Fair     Standing balance support: Bilateral upper extremity supported;During functional activity Standing balance-Leahy Scale: Poor                               Pertinent Vitals/Pain Pain Assessment: No/denies pain    Home Living Family/patient expects to be discharged to:: Private residence Living Arrangements: Alone Available Help at Discharge: Family;Friend(s);Available PRN/intermittently;Other (Comment) (pt stated available assist when needed) Type of Home: House Home Access: Stairs to enter Entrance Stairs-Rails: Right;Left Entrance Stairs-Number of Steps: 4 Home Layout: Two level;Bed/bath upstairs Home Equipment: Walker - 2 wheels;Cane - single point;Crutches Additional Comments: Pt stated that he kept all of his equipment from previous surgery    Prior Function Level of Independence: Independent               Hand Dominance        Extremity/Trunk Assessment   Upper Extremity Assessment: Defer to OT evaluation           Lower Extremity Assessment: LLE deficits/detail   LLE Deficits / Details: Pt with decreased strength and ROM limitations secondary to post-op. Sensation grossly intact.     Communication   Communication: No difficulties  Cognition Arousal/Alertness: Awake/alert Behavior During Therapy: WFL for tasks  assessed/performed Overall Cognitive Status: Within Functional Limits for tasks assessed                      General Comments      Exercises Total Joint Exercises Ankle Circles/Pumps: AROM;Strengthening;Left;10  reps;Seated Long Arc Quad: AROM;Strengthening;Left;Seated;5 reps Knee Flexion: AROM;Strengthening;Left;5 reps;Seated Goniometric ROM: Flexion: 85 degrees; Extension: lacking 29 degrees (seated) Marching in Standing: AROM;Strengthening;Left;10 reps;Seated      Assessment/Plan    PT Assessment Patient needs continued PT services  PT Diagnosis Difficulty walking   PT Problem List Decreased strength;Decreased range of motion;Decreased activity tolerance;Decreased balance;Decreased mobility;Decreased coordination;Decreased knowledge of use of DME;Pain  PT Treatment Interventions DME instruction;Stair training;Gait training;Functional mobility training;Therapeutic activities;Therapeutic exercise;Balance training;Patient/family education   PT Goals (Current goals can be found in the Care Plan section) Acute Rehab PT Goals Patient Stated Goal: return home PT Goal Formulation: With patient Time For Goal Achievement: 04/10/16 Potential to Achieve Goals: Good    Frequency 7X/week   Barriers to discharge        Co-evaluation               End of Session Equipment Utilized During Treatment: Gait belt Activity Tolerance: Patient limited by pain Patient left: in chair;with call bell/phone within reach Nurse Communication: Mobility status         Time: [id number] PT Time Calculation (min) (ACUTE ONLY): 25 min   Charges:   PT Evaluation $PT Eval Moderate Complexity: 1 Procedure PT Treatments $Gait Training: 8-22 mins   PT G CodesClearnce Sorrel Evia Goldsmith 04/03/2016, 9:56 AM Sherie Don, [place], DPT [phone]

## 2016-04-03 NOTE — Progress Notes (Signed)
PT Cancellation Note  Patient Details Name: Chad Roth MRN: [id number] DOB: 1955-12-05   Cancelled Treatment:    Reason Eval/Treat Not Completed: Patient declined, no reason specified. Pt stated that he was having increased pain in his L knee and declined second therapy session today. PT notified pt's nurse and nurse reported that pt has been ambulating within his room throughout the day. PT will continue to f/u with pt as appropriate.   Chad Roth 04/03/2016, 2:13 PM Sherie Don, [place], DPT [phone]

## 2016-04-03 NOTE — Progress Notes (Signed)
Subjective: 1 Day Post-Op Procedure(s) (LRB): COMPUTER ASSISTED TOTAL KNEE ARTHROPLASTY (Left) Patient reports pain as mild.    Objective: Vital signs in last 24 hours: Temp:  [97.5 F (36.4 C)-98.1 F (36.7 C)] 97.6 F (36.4 C) (08/01 0422) Pulse Rate:  [61-81] 76 (08/01 0422) Resp:  [12-19] 18 (08/01 0422) BP: (118-176)/(72-97) 118/72 (08/01 0422) SpO2:  [95 %-99 %] 98 % (08/01 0422) Weight:  [82.9 kg (182 lb 12.8 oz)] 82.9 kg (182 lb 12.8 oz) (07/31 1030)  Intake/Output from previous day: 07/31 0701 - 08/01 0700 In: 2200 [P.O.:540; I.V.:1660] Out: 550 [Urine:450; Blood:100] Intake/Output this shift: No intake/output data recorded.   Recent Labs  04/03/16 0546  HGB 11.9*    Recent Labs  04/03/16 0546  WBC 10.5  RBC 4.13*  HCT 37.4*  PLT 253    Recent Labs  04/03/16 0546  NA 136  K 3.7  CL 105  CO2 27  BUN 11  CREATININE 0.86  GLUCOSE 139*  CALCIUM 8.4*    Recent Labs  04/02/16 1049  INR 1.09    Neurologically intact  Assessment/Plan: 1 Day Post-Op Procedure(s) (LRB): COMPUTER ASSISTED TOTAL KNEE ARTHROPLASTY (Left) Up with therapy  Chad Roth C 04/03/2016, 7:41 AM

## 2016-04-03 NOTE — Anesthesia Postprocedure Evaluation (Signed)
Anesthesia Post Note  Patient: Chad Roth  Procedure(s) Performed: Procedure(s) (LRB): COMPUTER ASSISTED TOTAL KNEE ARTHROPLASTY (Left)  Patient location during evaluation: PACU Anesthesia Type: General Level of consciousness: awake and alert and patient cooperative Pain management: pain level controlled Vital Signs Assessment: post-procedure vital signs reviewed and stable Respiratory status: spontaneous breathing and respiratory function stable Cardiovascular status: stable Anesthetic complications: no    Last Vitals:  Vitals:   04/02/16 1634 04/03/16 0422  BP: (!) 143/85 118/72  Pulse: 77 76  Resp: 16 18  Temp: 36.4 C 36.4 C    Last Pain:  Vitals:   04/03/16 0422  TempSrc: Oral  PainSc:                  Energy S

## 2016-04-03 NOTE — Evaluation (Signed)
Occupational Therapy Evaluation and Discharge Patient Details Name: Chad Roth MRN: [id number] DOB: [birth year] Today's Date: 04/03/2016    History of Present Illness Pt is a 60 y/o male s/p L TKA. PMH including R TKA in 01/2016.   Clinical Impression   Pt returning to bed from recliner with RW by himself upon OT's arrival. Moving well. Pt with all necessary home medical equipment and adaptive equipment from his R TKA in May. Plans to sponge bathe until he is able to step over the edge of his tub. No further OT needs.    Follow Up Recommendations  No OT follow up    Equipment Recommendations  None recommended by OT    Recommendations for Other Services       Precautions / Restrictions Precautions Precautions: Knee Restrictions Weight Bearing Restrictions: Yes LLE Weight Bearing: Weight bearing as tolerated      Mobility Bed Mobility Overal bed mobility: Modified Independent             General bed mobility comments: pt returned to bed without assist  Transfers Overall transfer level: Needs assistance Equipment used: Rolling walker (2 wheeled) Transfers: Sit to/from Stand Sit to Stand: Supervision;Modified independent (Device/Increase time)              Balance                                            ADL Overall ADL's : Needs assistance/impaired Eating/Feeding: Independent;Sitting   Grooming: Wash/dry hands;Standing;Supervision/safety   Upper Body Bathing: Set up;Sitting   Lower Body Bathing: Sit to/from stand;Set up   Upper Body Dressing : Sitting;Set up   Lower Body Dressing: Set up;Sit to/from stand   Toilet Transfer: Supervision/safety;RW;Ambulation Armed forces technical officer Details (indicate cue type and reason): pt is aware he can use 3 in 1 over toilet, but does not prefer         Functional mobility during ADLs: Supervision/safety;Rolling walker General ADL Comments: Reviewed technique for tub transfer, pt does not want a  shower seat, plans to stand when he is more stable.     Vision     Perception     Praxis      Pertinent Vitals/Pain Pain Assessment: 0-10 Pain Score: 5  Pain Location: L knee Pain Descriptors / Indicators: Aching Pain Intervention(s): Repositioned;Ice applied;Premedicated before session     Hand Dominance Right   Extremity/Trunk Assessment Upper Extremity Assessment Upper Extremity Assessment: Overall WFL for tasks assessed   Lower Extremity Assessment Lower Extremity Assessment: Defer to PT evaluation       Communication Communication Communication: No difficulties   Cognition Arousal/Alertness: Awake/alert Behavior During Therapy: WFL for tasks assessed/performed Overall Cognitive Status: Within Functional Limits for tasks assessed                     General Comments       Exercises       Shoulder Instructions      Home Living Family/patient expects to be discharged to:: Private residence Living Arrangements: Alone Available Help at Discharge: Family;Friend(s);Available PRN/intermittently Type of Home: House Home Access: Stairs to enter [employer] of Steps: 4 Entrance Stairs-Rails: Right;Left Home Layout: Two level;Bed/bath upstairs Alternate Level Stairs-Number of Steps: full flight Alternate Level Stairs-Rails: Right;Left Bathroom Shower/Tub: Tub/shower unit;Door   [employer] Toilet: Standard     Home Equipment: Environmental consultant - 2 wheels;Cane -  single point;Crutches;Adaptive equipment;Bedside commode Adaptive Equipment: Reacher;Sock aid;Long-handled shoe horn;Long-handled sponge Additional Comments: Pt stated that he kept all of his equipment from previous surgery      Prior Functioning/Environment Level of Independence: Independent        Comments: pt recovered well from his previous TKA, was replacing boat engines    OT Diagnosis: Generalized weakness;Acute pain   OT Problem List:     OT Treatment/Interventions:      OT  Goals(Current goals can be found in the care plan section) Acute Rehab OT Goals Patient Stated Goal: return home  OT Frequency:     Barriers to D/C:            Co-evaluation              End of Session Equipment Utilized During Treatment: Gait belt;Rolling walker  Activity Tolerance: Patient tolerated treatment well Patient left: with call bell/phone within reach;in bed   Time: 1219-1249 OT Time Calculation (min): 30 min Charges:  OT General Charges $OT Visit: 1 Procedure OT Evaluation $OT Eval Low Complexity: 1 Procedure G-Codes:    Malka So 04/03/2016, 1:34 PM  [phone]

## 2016-04-04 LAB — CBC
HCT: 35.2 % — ABNORMAL LOW (ref 39.0–52.0)
Hemoglobin: 11.2 g/dL — ABNORMAL LOW (ref 13.0–17.0)
MCH: 28.7 pg (ref 26.0–34.0)
MCHC: 31.8 g/dL (ref 30.0–36.0)
MCV: 90.3 fL (ref 78.0–100.0)
PLATELETS: 222 10*3/uL (ref 150–400)
RBC: 3.9 MIL/uL — ABNORMAL LOW (ref 4.22–5.81)
RDW: 14.1 % (ref 11.5–15.5)
WBC: 7.3 10*3/uL (ref 4.0–10.5)

## 2016-04-04 MED ORDER — METHOCARBAMOL 500 MG PO TABS: 500.0000 mg | ORAL_TABLET | Freq: Four times a day (QID) | ORAL | 0 refills | Status: DC

## 2016-04-04 MED ORDER — OXYCODONE-ACETAMINOPHEN 5-325 MG PO TABS
1.0000 | ORAL_TABLET | ORAL | Status: DC | PRN
  Administered 2016-04-04: 2 via ORAL
  Filled 2016-04-04: qty 2

## 2016-04-04 MED ORDER — OXYCODONE-ACETAMINOPHEN 5-325 MG PO TABS: 1.0000 | ORAL_TABLET | ORAL | 0 refills | Status: DC | PRN

## 2016-04-04 MED ORDER — ASPIRIN 325 MG PO TABS: 325.0000 mg | ORAL_TABLET | Freq: Every day | ORAL | Status: DC

## 2016-04-04 NOTE — Discharge Instructions (Addendum)
INSTRUCTIONS AFTER TOTAL KNEE JOINT REPLACEMENT   o Remove items at home which could result in a fall. This includes throw rugs or furniture in walking pathways o ICE to the affected joint every three hours while awake for 30 minutes at a time, for at least the first 3-5 days, and then as needed for pain and swelling.  Continue to use ice for pain and swelling. You may notice swelling that will progress down to the foot and ankle.  This is normal after surgery.  Elevate your leg when you are not up walking on it.   o Continue to use the breathing machine you got in the hospital (incentive spirometer) which will help keep your temperature down.  It is common for your temperature to cycle up and down following surgery, especially at night when you are not up moving around and exerting yourself.  The breathing machine keeps your lungs expanded and your temperature down.   DIET:  As you were doing prior to hospitalization, we recommend a well-balanced diet.  DRESSING / WOUND CARE / SHOWERING  You may change your dressing 3-5 days after surgery.  Then change the dressing every day with sterile gauze.  Please use good hand washing techniques before changing the dressing.  Do not use any lotions or creams on the incision until instructed by your surgeon.  ACTIVITY  o Increase activity slowly as tolerated, but follow the weight bearing instructions below.   o No driving for 6 weeks or until further direction given by your physician.  You cannot drive while taking narcotics.  o No lifting or carrying greater than 10 lbs. until further directed by your surgeon. o Avoid periods of inactivity such as sitting longer than an hour when not asleep. This helps prevent blood clots.  o You may return to work once you are authorized by your doctor.     WEIGHT BEARING   Weight bearing as tolerated with assist device (walker, cane, etc) as directed, use it as long as suggested by your surgeon or therapist,  typically at least 4-6 weeks.   EXERCISES  Results after joint replacement surgery are often greatly improved when you follow the exercise, range of motion and muscle strengthening exercises prescribed by your doctor. Safety measures are also important to protect the joint from further injury. Any time any of these exercises cause you to have increased pain or swelling, decrease what you are doing until you are comfortable again and then slowly increase them. If you have problems or questions, call your caregiver or physical therapist for advice.   Rehabilitation is important following a joint replacement. After just a few days of immobilization, the muscles of the leg can become weakened and shrink (atrophy).  These exercises are designed to build up the tone and strength of the thigh and leg muscles and to improve motion. Often times heat used for twenty to thirty minutes before working out will loosen up your tissues and help with improving the range of motion but do not use heat for the first two weeks following surgery (sometimes heat can increase post-operative swelling).   These exercises can be done on a training (exercise) mat, on the floor, on a table or on a bed. Use whatever works the best and is most comfortable for you.    Use music or television while you are exercising so that the exercises are a pleasant break in your day. This will make your life better with the exercises acting as  a break in your routine that you can look forward to.   Perform all exercises about fifteen times, three times per day or as directed.  You should exercise both the operative leg and the other leg as well.  Exercises include:    Quad Sets - Tighten up the muscle on the front of the thigh (Quad) and hold for 5-10 seconds.    Straight Leg Raises - With your knee straight (if you were given a brace, keep it on), lift the leg to 60 degrees, hold for 3 seconds, and slowly lower the leg.  Perform this exercise  against resistance later as your leg gets stronger.   Leg Slides: Lying on your back, slowly slide your foot toward your buttocks, bending your knee up off the floor (only go as far as is comfortable). Then slowly slide your foot back down until your leg is flat on the floor again.   Angel Wings: Lying on your back spread your legs to the side as far apart as you can without causing discomfort.   Hamstring Strength:  Lying on your back, push your heel against the floor with your leg straight by tightening up the muscles of your buttocks.  Repeat, but this time bend your knee to a comfortable angle, and push your heel against the floor.  You may put a pillow under the heel to make it more comfortable if necessary.   A rehabilitation program following joint replacement surgery can speed recovery and prevent re-injury in the future due to weakened muscles. Contact your doctor or a physical therapist for more information on knee rehabilitation.    CONSTIPATION  Constipation is defined medically as fewer than three stools per week and severe constipation as less than one stool per week.  Even if you have a regular bowel pattern at home, your normal regimen is likely to be disrupted due to multiple reasons following surgery.  Combination of anesthesia, postoperative narcotics, change in appetite and fluid intake all can affect your bowels.   YOU MUST use at least one of the following options; they are listed in order of increasing strength to get the job done.  They are all available over the counter, and you may need to use some, POSSIBLY even all of these options:    Drink plenty of fluids (prune juice may be helpful) and high fiber foods Colace 100 mg by mouth twice a day  Senokot for constipation as directed and as needed Dulcolax (bisacodyl), take with full glass of water  Miralax (polyethylene glycol) once or twice a day as needed.  If you have tried all these things and are unable to have a  bowel movement in the first 3-4 days after surgery call either your surgeon or your primary doctor.    If you experience loose stools or diarrhea, hold the medications until you stool forms back up.  If your symptoms do not get better within 1 week or if they get worse, check with your doctor.  If you experience "the worst abdominal pain ever" or develop nausea or vomiting, please contact the office immediately for further recommendations for treatment.   ITCHING:  If you experience itching with your medications, try taking only a single pain pill, or even half a pain pill at a time.  You can also use Benadryl over the counter for itching or also to help with sleep.   TED HOSE STOCKINGS:  Use stockings on both legs until for at least 2 weeks  or as directed by physician office. They may be removed at night for sleeping.  MEDICATIONS:  See your medication summary on the After Visit Summary that nursing will review with you.  You may have some home medications which will be placed on hold until you complete the course of blood thinner medication.  It is important for you to complete the blood thinner medication as prescribed.  PRECAUTIONS:  If you experience chest pain or shortness of breath - call 911 immediately for transfer to the hospital emergency department.   If you develop a fever greater that 101 F, purulent drainage from wound, increased redness or drainage from wound, foul odor from the wound/dressing, or calf pain - CONTACT YOUR SURGEON.                                                   FOLLOW-UP APPOINTMENTS:  If you do not already have a post-op appointment, please call the office for an appointment to be seen by your surgeon.  Guidelines for how soon to be seen are listed in your After Visit Summary, but are typically between 1-4 weeks after surgery.  OTHER INSTRUCTIONS:   Knee Replacement:  Do not place pillow under knee, focus on keeping the knee straight while resting. CPM  instructions: 0-90 degrees, 2 hours in the morning, 2 hours in the afternoon, and 2 hours in the evening. Place foam block, curve side up under heel at all times except when in CPM or when walking.  DO NOT modify, tear, cut, or change the foam block in any way.  MAKE SURE YOU:   Understand these instructions.   Get help right away if you are not doing well or get worse.    Thank you for letting us be a part of your medical care team.  It is a privilege we respect greatly.  We hope these instructions will help you stay on track for a fast and full recovery!      Ok to shower , work on quad strength, knee flexion. See Dr. Lorin Mercy in 1 to 2 wks

## 2016-04-04 NOTE — Progress Notes (Addendum)
Subjective: 2 Days Post-Op Procedure(s) (LRB): COMPUTER ASSISTED TOTAL KNEE ARTHROPLASTY (Left) Patient reports pain as moderate.    Objective: Vital signs in last 24 hours: Temp:  [97.7 F (36.5 C)-98.7 F (37.1 C)] 98.2 F (36.8 C) (08/02 0547) Pulse Rate:  [74-82] 74 (08/02 0547) Resp:  [18] 18 (08/02 0547) BP: (125-158)/(75-78) 158/78 (08/02 0547) SpO2:  [97 %-98 %] 97 % (08/02 0547)  Intake/Output from previous day: 08/01 0701 - 08/02 0700 In: 720 [P.O.:720] Out: 1875 [Urine:1875] Intake/Output this shift: No intake/output data recorded.   Recent Labs  04/03/16 0546 04/04/16 0614  HGB 11.9* 11.2*    Recent Labs  04/03/16 0546 04/04/16 0614  WBC 10.5 7.3  RBC 4.13* 3.90*  HCT 37.4* 35.2*  PLT 253 222    Recent Labs  04/03/16 0546  NA 136  K 3.7  CL 105  CO2 27  BUN 11  CREATININE 0.86  GLUCOSE 139*  CALCIUM 8.4*    Recent Labs  04/02/16 1049  INR 1.09    Neurologically intact  Assessment/Plan: 2 Days Post-Op Procedure(s) (LRB): COMPUTER ASSISTED TOTAL KNEE ARTHROPLASTY (Left) Up with therapy discharge home today PATIENT SAYS HE DOES WANT HHPT. HE WILL BE STAYING WITH FRIEND CHRIS FOR SEVERAL DAYS.  Naria Abbey C 04/04/2016, 7:17 AM

## 2016-04-04 NOTE — Care Management (Signed)
Patient has changed his mind and wants Home Health PT. Case manager called referral to [facility].

## 2016-04-04 NOTE — Progress Notes (Signed)
Physical Therapy Treatment Patient Details Name: Chad Roth MRN: [id number] DOB: [birth year] Today's Date: 04/04/2016    History of Present Illness Pt is a 60 y/o male s/p L TKA. PMH including R TKA in 01/2016.    PT Comments    At the request of the pt, PT returned to pt's room for second therapy session this AM. Initially, pt requested the therapist return in 15 minutes to allow for the pain medication to take effect. PT honored the pt's request and returned in approximately 20 minutes to begin this session.  Pt refused to participate in stair training or ambulate during this session as he stated that his pain was too severe. Pt did agree to participate in therapeutic exercises and allowed therapist to take a goniometric measurement of knee flexion and extension (see below). Pt would continue to benefit from skilled physical therapy services at this time while admitted and after d/c to address his limitations in order to improve his overall safety and independence with functional mobility.    Follow Up Recommendations  Home health PT;Supervision for mobility/OOB     Equipment Recommendations  None recommended by PT;Other (comment) (pt reported having all necessary equipment at home)    Recommendations for Other Services       Precautions / Restrictions Precautions Precautions: Knee Restrictions Weight Bearing Restrictions: Yes LLE Weight Bearing: Weight bearing as tolerated    Mobility  Bed Mobility Overal bed mobility: Needs Assistance Bed Mobility: Supine to Sit;Sit to Supine     Supine to sit: Min assist;HOB elevated Sit to supine: Min assist   General bed mobility comments: pt presented sitting OOB in recliner when PT entered room.  Transfers Overall transfer level: Needs assistance Equipment used: Rolling walker (2 wheeled) Transfers: Sit to/from Stand Sit to Stand: Min guard         General transfer comment: Pt refused to perform transfers or ambulate as he  reported that he was just ambulating to his bathroom and within his room prior to PT coming.  Ambulation/Gait             General Gait Details: did not occur secondary to pain and pt refusal. Pt stated that he was just ambulating to his bathroom and within his room prior to PT coming.   Stairs            Wheelchair Mobility    Modified Rankin (Stroke Patients Only)       Balance Overall balance assessment: Needs assistance Sitting-balance support: Feet supported;No upper extremity supported Sitting balance-Leahy Scale: Fair     Standing balance support: Bilateral upper extremity supported;During functional activity Standing balance-Leahy Scale: Poor                      Cognition Arousal/Alertness: Awake/alert Behavior During Therapy: WFL for tasks assessed/performed;Agitated Overall Cognitive Status: Within Functional Limits for tasks assessed                      Exercises Total Joint Exercises Ankle Circles/Pumps: AROM;Strengthening;Left;10 reps;Seated Quad Sets: AROM;Strengthening;Left;5 reps;Seated Heel Slides: AROM;Strengthening;Left;5 reps;Seated Goniometric ROM: Flexion: 79 degrees; Extension: lacking 35 degrees to neutral; measured in sitting    General Comments        Pertinent Vitals/Pain Pain Assessment: 0-10 Pain Score: 9  Pain Location: L knee Pain Descriptors / Indicators: Guarding;Grimacing;Operative site guarding Pain Intervention(s): Monitored during session;Repositioned;Ice applied    Home Living  Prior Function            PT Goals (current goals can now be found in the care plan section) Acute Rehab PT Goals Patient Stated Goal: return home PT Goal Formulation: With patient Time For Goal Achievement: 04/10/16 Potential to Achieve Goals: Good Progress towards PT goals: Not progressing toward goals - comment (secondary to pain and pt refusing to perform stair training)     Frequency  7X/week    PT Plan Current plan remains appropriate    Co-evaluation             End of Session Equipment Utilized During Treatment: Gait belt Activity Tolerance: Patient limited by pain Patient left: in chair;with call bell/phone within reach     Time: 1030-1044 PT Time Calculation (min) (ACUTE ONLY): 14 min  Charges:  $Therapeutic Exercise: 8-22 mins $Therapeutic Activity: 8-22 mins                    G CodesClearnce Sorrel Mihail Prettyman 04-19-2016, 11:04 AM  Sherie Don, PT, DPT [phone]

## 2016-04-04 NOTE — Progress Notes (Signed)
Physical Therapy Treatment Patient Details Name: Chad Roth MRN: [id number] DOB: 07/09/1956 Today's Date: 04/04/2016    History of Present Illness Pt is a 60 y/o male s/p L TKA. PMH including R TKA in 01/2016.    PT Comments    Pt presented supine in bed with HOB elevated, awake and agitated. Pt reported having severe pain and stating that "its a 20 out of 10". Pt requested to put on his clothes as he was told he would be d/c'ing home later today. Pt expressed frustration regarding d/c'ing today and stated that his pain was too severe. Pt refused to further participate in therapy session after PT assisted him with dressing at bedside. Pt would continue to benefit from skilled physical therapy services at this time while admitted and after d/c to address his limitations in order to improve his overall safety and independence with functional mobility.    Follow Up Recommendations  Home health PT;Supervision for mobility/OOB     Equipment Recommendations  None recommended by PT;Other (comment) (pt reported that he has all necessary equipment at home)    Recommendations for Other Services       Precautions / Restrictions Precautions Precautions: Knee Restrictions Weight Bearing Restrictions: Yes LLE Weight Bearing: Weight bearing as tolerated    Mobility  Bed Mobility Overal bed mobility: Needs Assistance Bed Mobility: Supine to Sit;Sit to Supine     Supine to sit: Min assist;HOB elevated Sit to supine: Min assist   General bed mobility comments: pt required increased time and min A with L LE  Transfers Overall transfer level: Needs assistance Equipment used: Rolling walker (2 wheeled) Transfers: Sit to/from Stand Sit to Stand: Min guard         General transfer comment: Pt required increased time and VC'ing for bilateral hand positioning  Ambulation/Gait             General Gait Details: did not occur secondary to pain and pt refusal   Stairs             Wheelchair Mobility    Modified Rankin (Stroke Patients Only)       Balance Overall balance assessment: Needs assistance Sitting-balance support: Feet supported;No upper extremity supported Sitting balance-Leahy Scale: Fair     Standing balance support: Bilateral upper extremity supported;During functional activity Standing balance-Leahy Scale: Poor                      Cognition Arousal/Alertness: Awake/alert Behavior During Therapy: WFL for tasks assessed/performed;Agitated Overall Cognitive Status: Within Functional Limits for tasks assessed                      Exercises      General Comments        Pertinent Vitals/Pain Pain Assessment: 0-10 Pain Score: 10-Worst pain ever Pain Location: L knee Pain Descriptors / Indicators: Guarding;Grimacing;Operative site guarding Pain Intervention(s): Monitored during session;Repositioned;Ice applied;Patient requesting pain meds-RN notified    Home Living                      Prior Function            PT Goals (current goals can now be found in the care plan section) Acute Rehab PT Goals Patient Stated Goal: return home PT Goal Formulation: With patient Time For Goal Achievement: 04/10/16 Potential to Achieve Goals: Good Progress towards PT goals: Not progressing toward goals - comment (pt with significant pain as compared  to previous session)    Frequency  7X/week    PT Plan Current plan remains appropriate    Co-evaluation             End of Session Equipment Utilized During Treatment: Gait belt Activity Tolerance: Patient limited by pain Patient left: in bed;with call bell/phone within reach     Time: [id number] PT Time Calculation (min) (ACUTE ONLY): 14 min  Charges:  $Therapeutic Activity: 8-22 mins                    G CodesClearnce Sorrel Jodilyn Giese 2016/04/09, 10:08 AM Sherie Don, PT, DPT [phone]

## 2016-04-04 NOTE — Progress Notes (Signed)
Placed thigh high TED hose on patient.

## 2016-04-04 NOTE — Progress Notes (Signed)
Patient was refusing knee immobilizer this morning.  He did allow me to place on leg. Educated patient.

## 2016-04-04 NOTE — Progress Notes (Signed)
Reviewed discharge/medications with patient.  Answered all his questions.

## 2016-04-04 NOTE — Progress Notes (Signed)
Physical Therapy Treatment Patient Details Name: Chad Roth MRN: [id number] DOB: 1956-03-21 Today's Date: 04/04/2016    History of Present Illness Pt is a 60 y/o male s/p L TKA. PMH including R TKA in 01/2016.    PT Comments    Pt presented sitting OOB in recliner, awake and willing to participate in therapy session. Upon pt's request, PT returned for a third session today for stair training. Pt making good progress towards achieving his goals and he successfully completed stair training during this session. Pt would continue to benefit from skilled physical therapy services at this time while admitted and after d/c to address his limitations in order to improve his overall safety and independence with functional mobility.    Follow Up Recommendations  Home health PT;Supervision for mobility/OOB     Equipment Recommendations  None recommended by PT;Other (comment) (pt reported having all necessary equipment at home)    Recommendations for Other Services       Precautions / Restrictions Precautions Precautions: Knee Restrictions Weight Bearing Restrictions: Yes LLE Weight Bearing: Weight bearing as tolerated    Mobility  Bed Mobility Overal bed mobility: Needs Assistance Bed Mobility: Sit to Supine       Sit to supine: Min assist (with L LE)   General bed mobility comments: pt required increased time to complete task and min A with L LE  Transfers Overall transfer level: Needs assistance Equipment used: Rolling walker (2 wheeled) Transfers: Sit to/from Stand Sit to Stand: Min guard         General transfer comment: Pt required increased time to complete  Ambulation/Gait Ambulation/Gait assistance: Min guard Ambulation Distance (Feet): 50 Feet (50 ft x2 with stair training in between) Assistive device: Rolling walker (2 wheeled) Gait Pattern/deviations: Step-through pattern;Decreased step length - right;Decreased stance time - left Gait velocity: decreased Gait  velocity interpretation: Below normal speed for age/gender     Stairs Stairs: Yes Stairs assistance: Min guard Stair Management: One rail Left Number of Stairs: 3 General stair comments: pt required VC'ing for technique and sequencing  Wheelchair Mobility    Modified Rankin (Stroke Patients Only)       Balance Overall balance assessment: Needs assistance Sitting-balance support: Feet supported;No upper extremity supported Sitting balance-Leahy Scale: Fair     Standing balance support: Bilateral upper extremity supported;During functional activity Standing balance-Leahy Scale: Poor                      Cognition Arousal/Alertness: Awake/alert Behavior During Therapy: WFL for tasks assessed/performed Overall Cognitive Status: Within Functional Limits for tasks assessed                      Exercises      General Comments        Pertinent Vitals/Pain Pain Assessment: 0-10 Pain Score: 2  (at rest) Pain Location: L knee Pain Descriptors / Indicators: Discomfort;Guarding;Grimacing;Operative site guarding Pain Intervention(s): Monitored during session;Repositioned    Home Living                      Prior Function            PT Goals (current goals can now be found in the care plan section) Acute Rehab PT Goals Patient Stated Goal: return home PT Goal Formulation: With patient Time For Goal Achievement: 04/10/16 Potential to Achieve Goals: Good Progress towards PT goals: Progressing toward goals    Frequency  7X/week  PT Plan Current plan remains appropriate    Co-evaluation             End of Session Equipment Utilized During Treatment: Gait belt Activity Tolerance: Patient limited by pain Patient left: in bed;with call bell/phone within reach     Time: 1447-1506 PT Time Calculation (min) (ACUTE ONLY): 19 min  Charges:  $Gait Training: 8-22 mins                    G CodesClearnce Sorrel Rukia Mcgillivray Apr 15, 2016,  3:39 PM  Sherie Don, [place], DPT [phone]

## 2016-05-03 NOTE — Discharge Summary (Signed)
Patient ID: Chad Roth MRN: [id number] DOB/AGE: [id number] y.o.  Admit date: 04/02/2016 Discharge date: 05/03/2016  Admission Diagnoses:  Active Problems:   Left knee DJD   Discharge Diagnoses:  Active Problems:   Left knee DJD  status post Procedure(s): COMPUTER ASSISTED TOTAL KNEE ARTHROPLASTY  Past Medical History:  Diagnosis Date  . ADHD (attention deficit hyperactivity disorder)   . Arthritis   . GERD (gastroesophageal reflux disease)   . Gout     Surgeries: Procedure(s): COMPUTER ASSISTED TOTAL KNEE ARTHROPLASTY on 04/02/2016   Consultants:   Discharged Condition: Improved  Hospital Course: Chad Roth is an 60 y.o. male who was admitted 04/02/2016 for operative treatment of knee djd. Patient failed conservative treatments (please see the history and physical for the specifics) and had severe unremitting pain that affects sleep, daily activities and work/hobbies. After pre-op clearance, the patient was taken to the operating room on 04/02/2016 and underwent  Procedure(s): COMPUTER ASSISTED TOTAL KNEE ARTHROPLASTY.    Patient was given perioperative antibiotics:  Anti-infectives    Start     Dose/Rate Route Frequency Ordered Stop   04/02/16 1830  ceFAZolin (ANCEF) IVPB 1 g/50 mL premix     1 g 100 mL/hr over 30 Minutes Intravenous Every 8 hours 04/02/16 1721 04/03/16 0706   04/02/16 1200  ceFAZolin (ANCEF) IVPB 2g/100 mL premix     2 g 200 mL/hr over 30 Minutes Intravenous To [facility]Stay [facility] 04/01/16 1123 04/02/16 1221       Patient was given sequential compression devices and early ambulation to prevent DVT.   Patient benefited maximally from hospital stay and there were no complications. At the time of discharge, the patient was urinating/moving their bowels without difficulty, tolerating a regular diet, pain is controlled with oral pain medications and they have been cleared by PT/OT.   Recent vital signs: No data found.    Recent laboratory  studies: No results for input(s): WBC, HGB, HCT, PLT, NA, K, CL, CO2, BUN, CREATININE, GLUCOSE, INR, CALCIUM in the last 72 hours.  Invalid input(s): PT, 2   Discharge Medications:     Medication List    STOP taking these medications   aspirin 325 MG EC tablet Replaced by:  aspirin 325 MG tablet   oxyCODONE-acetaminophen 7.5-325 MG tablet Commonly known as:  PERCOCET Replaced by:  oxyCODONE-acetaminophen 5-325 MG tablet     TAKE these medications   allopurinol 300 MG tablet Commonly known as:  ZYLOPRIM Take 300 mg by mouth daily.   amphetamine-dextroamphetamine 20 MG tablet Commonly known as:  ADDERALL Take 20 mg by mouth 2 (two) times daily as needed (pt states that he doesn't use often).   aspirin 325 MG tablet Commonly known as:  BAYER ASPIRIN Take 1 tablet (325 mg total) by mouth daily. Replaces:  aspirin 325 MG EC tablet   methocarbamol 500 MG tablet Commonly known as:  ROBAXIN Take 1 tablet (500 mg total) by mouth 4 (four) times daily. What changed:  when to take this  reasons to take this   omeprazole 40 MG capsule Commonly known as:  PRILOSEC Take 40 mg by mouth daily.   oxyCODONE-acetaminophen 5-325 MG tablet Commonly known as:  ROXICET Take 1-2 tablets by mouth every 4 (four) hours as needed for severe pain. Replaces:  oxyCODONE-acetaminophen 7.5-325 MG tablet       Diagnostic Studies: No results found.    Follow-up Information    Marybelle Killings, MD Follow up in 2 week(s).   Specialty:  Orthopedic Surgery Contact information: [place] [address] [address] .   Why:  Someone from [facility] will contact you to arrange start date and time for therapy. Contact information: [place] [address] [phone]           Discharge Plan:  discharge to home  Disposition:     Signed: Lanae Crumbly for Elta Guadeloupe yates md 05/03/2016, 10:00  AM

## 2016-06-22 ENCOUNTER — Ambulatory Visit (INDEPENDENT_AMBULATORY_CARE_PROVIDER_SITE_OTHER): Payer: Medicaid Other | Admitting: Orthopaedic Surgery

## 2016-06-22 DIAGNOSIS — M75111 Incomplete rotator cuff tear or rupture of right shoulder, not specified as traumatic: Secondary | ICD-10-CM | POA: Diagnosis not present

## 2016-08-21 ENCOUNTER — Encounter (INDEPENDENT_AMBULATORY_CARE_PROVIDER_SITE_OTHER): Payer: Self-pay | Admitting: Orthopaedic Surgery

## 2016-08-21 ENCOUNTER — Encounter (INDEPENDENT_AMBULATORY_CARE_PROVIDER_SITE_OTHER): Payer: Self-pay

## 2016-08-21 ENCOUNTER — Ambulatory Visit (INDEPENDENT_AMBULATORY_CARE_PROVIDER_SITE_OTHER): Payer: Medicaid Other | Admitting: Orthopaedic Surgery

## 2016-08-21 VITALS — BP 139/92 | HR 99 | Ht 71.0 in | Wt 185.0 lb

## 2016-08-21 DIAGNOSIS — M546 Pain in thoracic spine: Secondary | ICD-10-CM
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
## 2016-10-02 ENCOUNTER — Ambulatory Visit (INDEPENDENT_AMBULATORY_CARE_PROVIDER_SITE_OTHER): Payer: Medicaid Other | Admitting: Orthopaedic Surgery

## 2016-10-14 IMAGING — CR DG CHEST 2V
2 series · 2 of 2 positions shown · non-contrast
Comparison: None.

CLINICAL DATA: Preop total knee arthroplasty

EXAM:
CHEST  2 VIEW

[w chest pa]
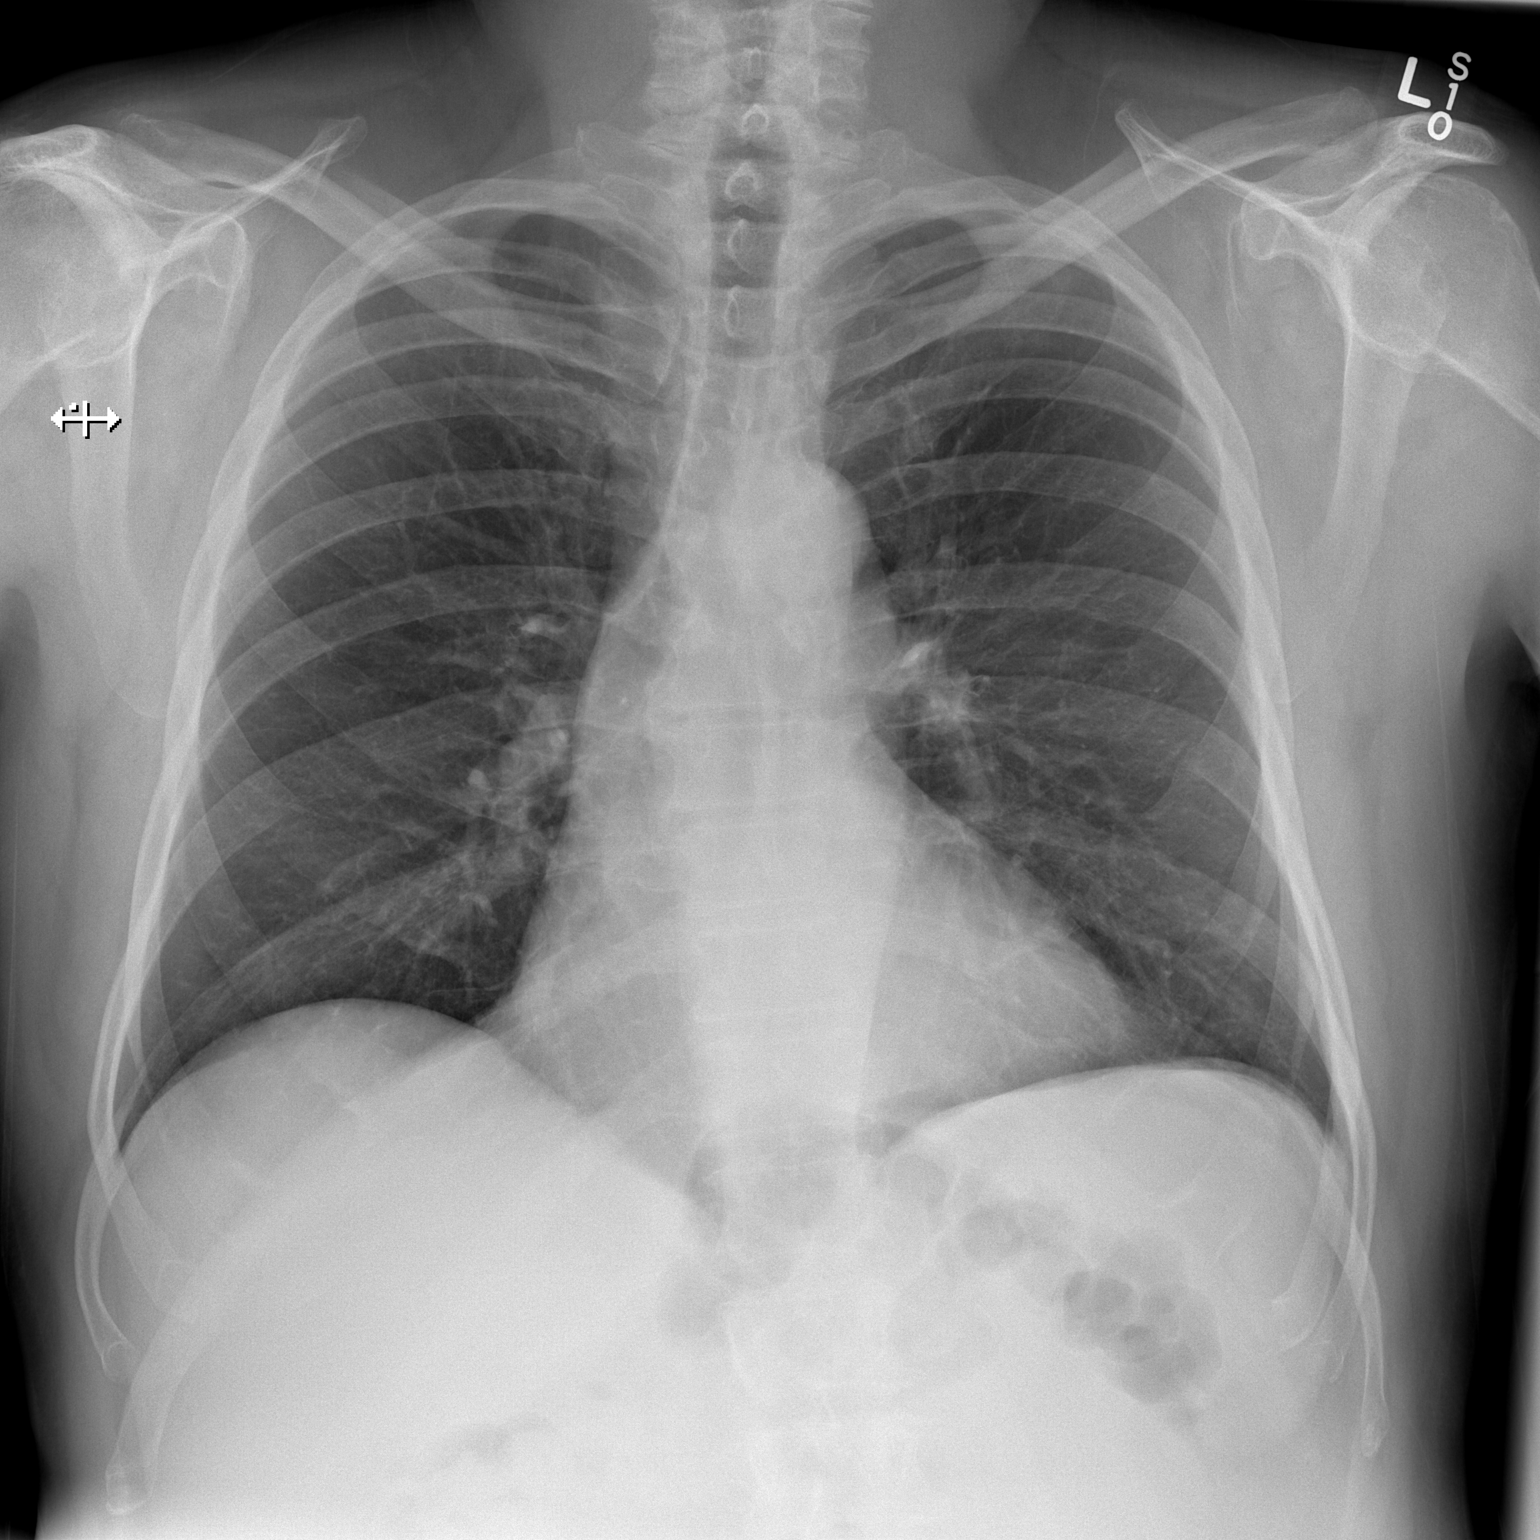

[w chest lat]
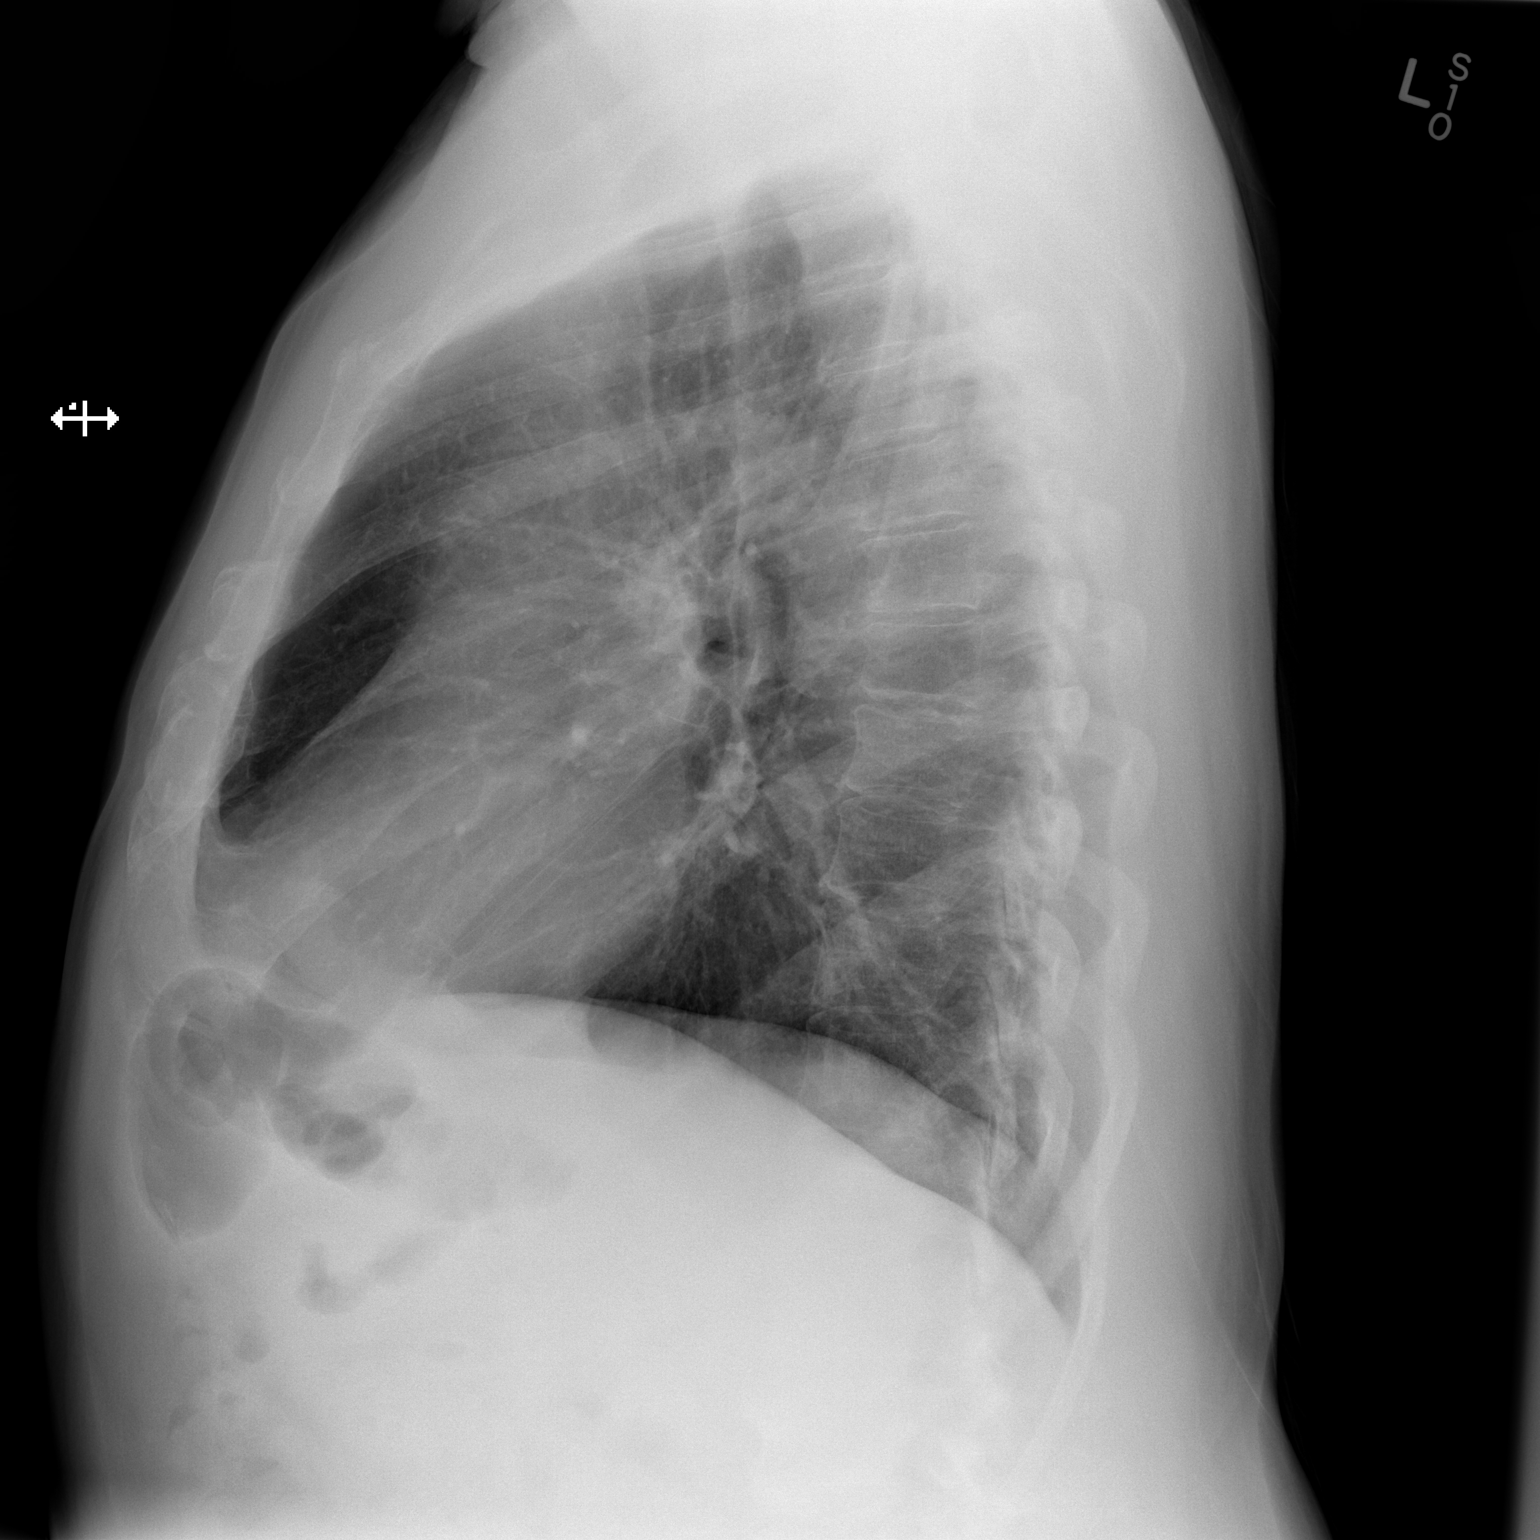

[2 of 2 positions shown; findings below may reference images not displayed]

FINDINGS: The heart size and mediastinal contours are within normal limits.
Both lungs are clear. The visualized skeletal structures are
unremarkable.
IMPRESSION: No active cardiopulmonary disease.

## 2016-10-20 IMAGING — CR DG KNEE 1-2V PORT*R*
2 series · 2 of 2 positions shown · non-contrast
Comparison: 12/13/2015

CLINICAL DATA: Total knee arthroplasty

EXAM:
PORTABLE RIGHT KNEE - 1-2 VIEW

[AP]
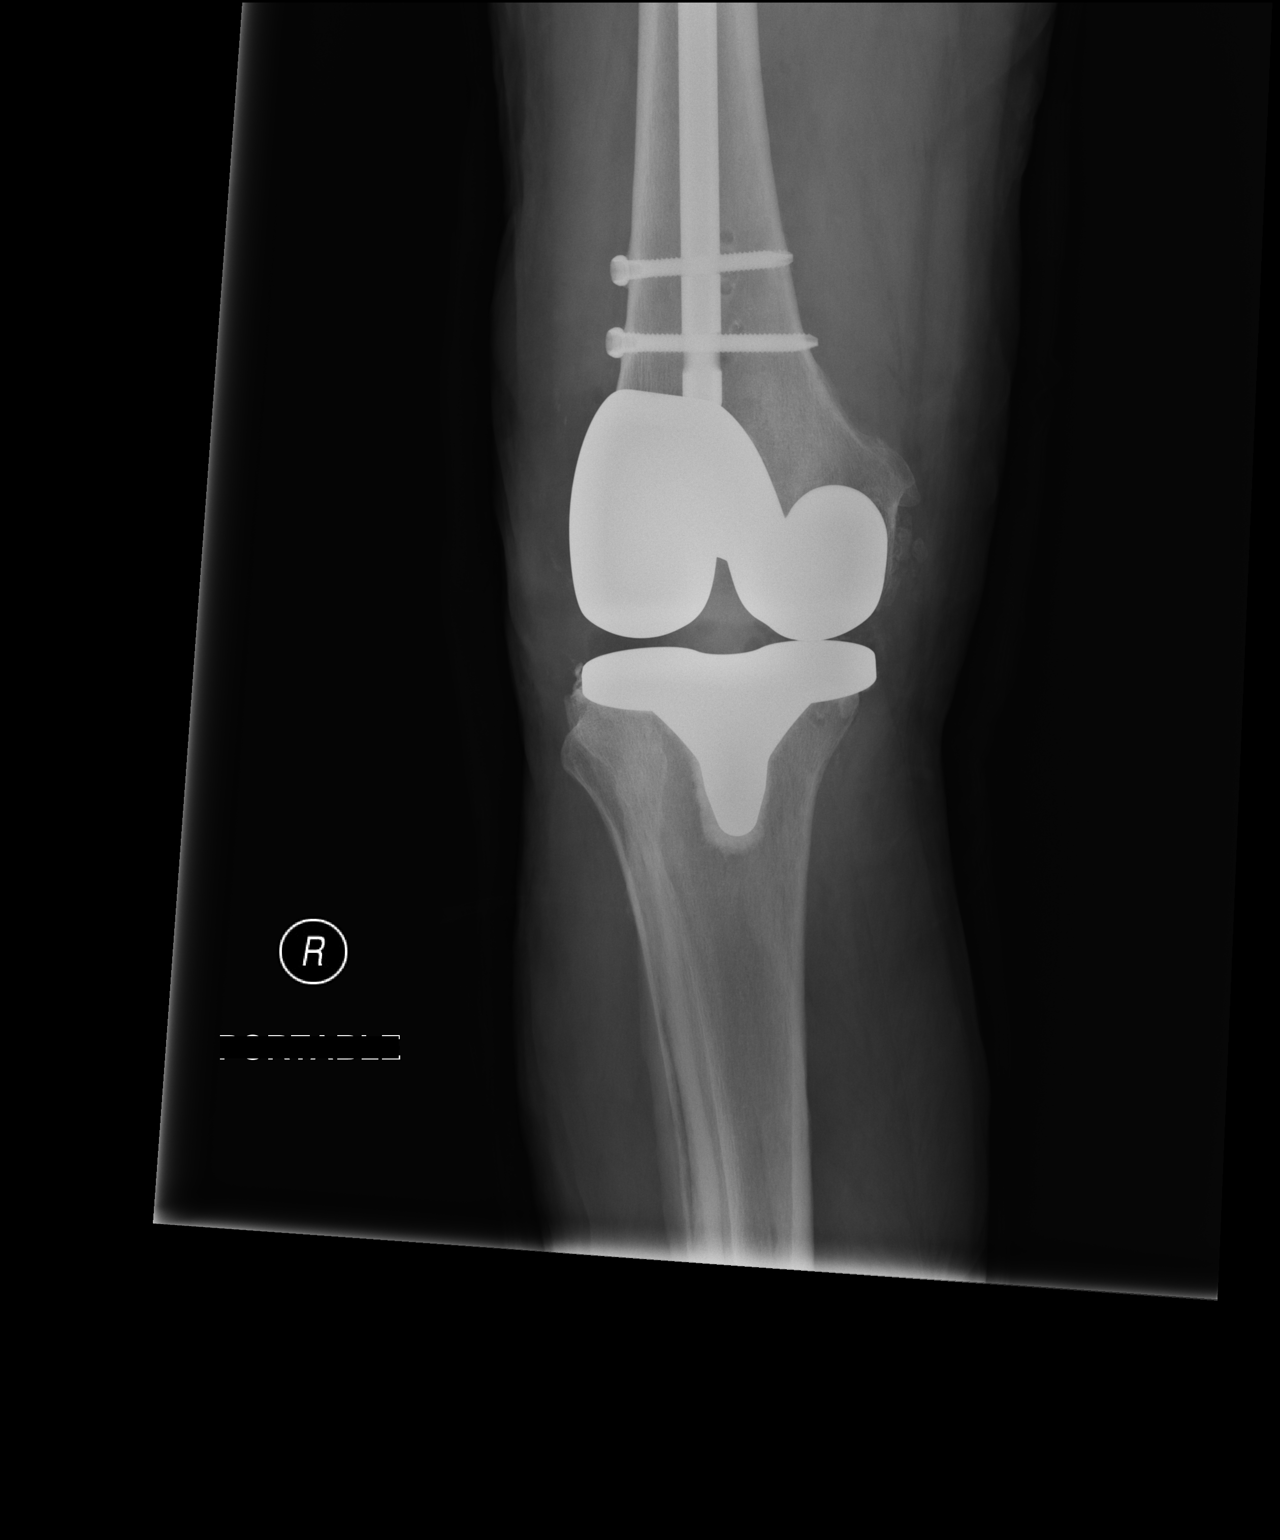

[xtable lateral]
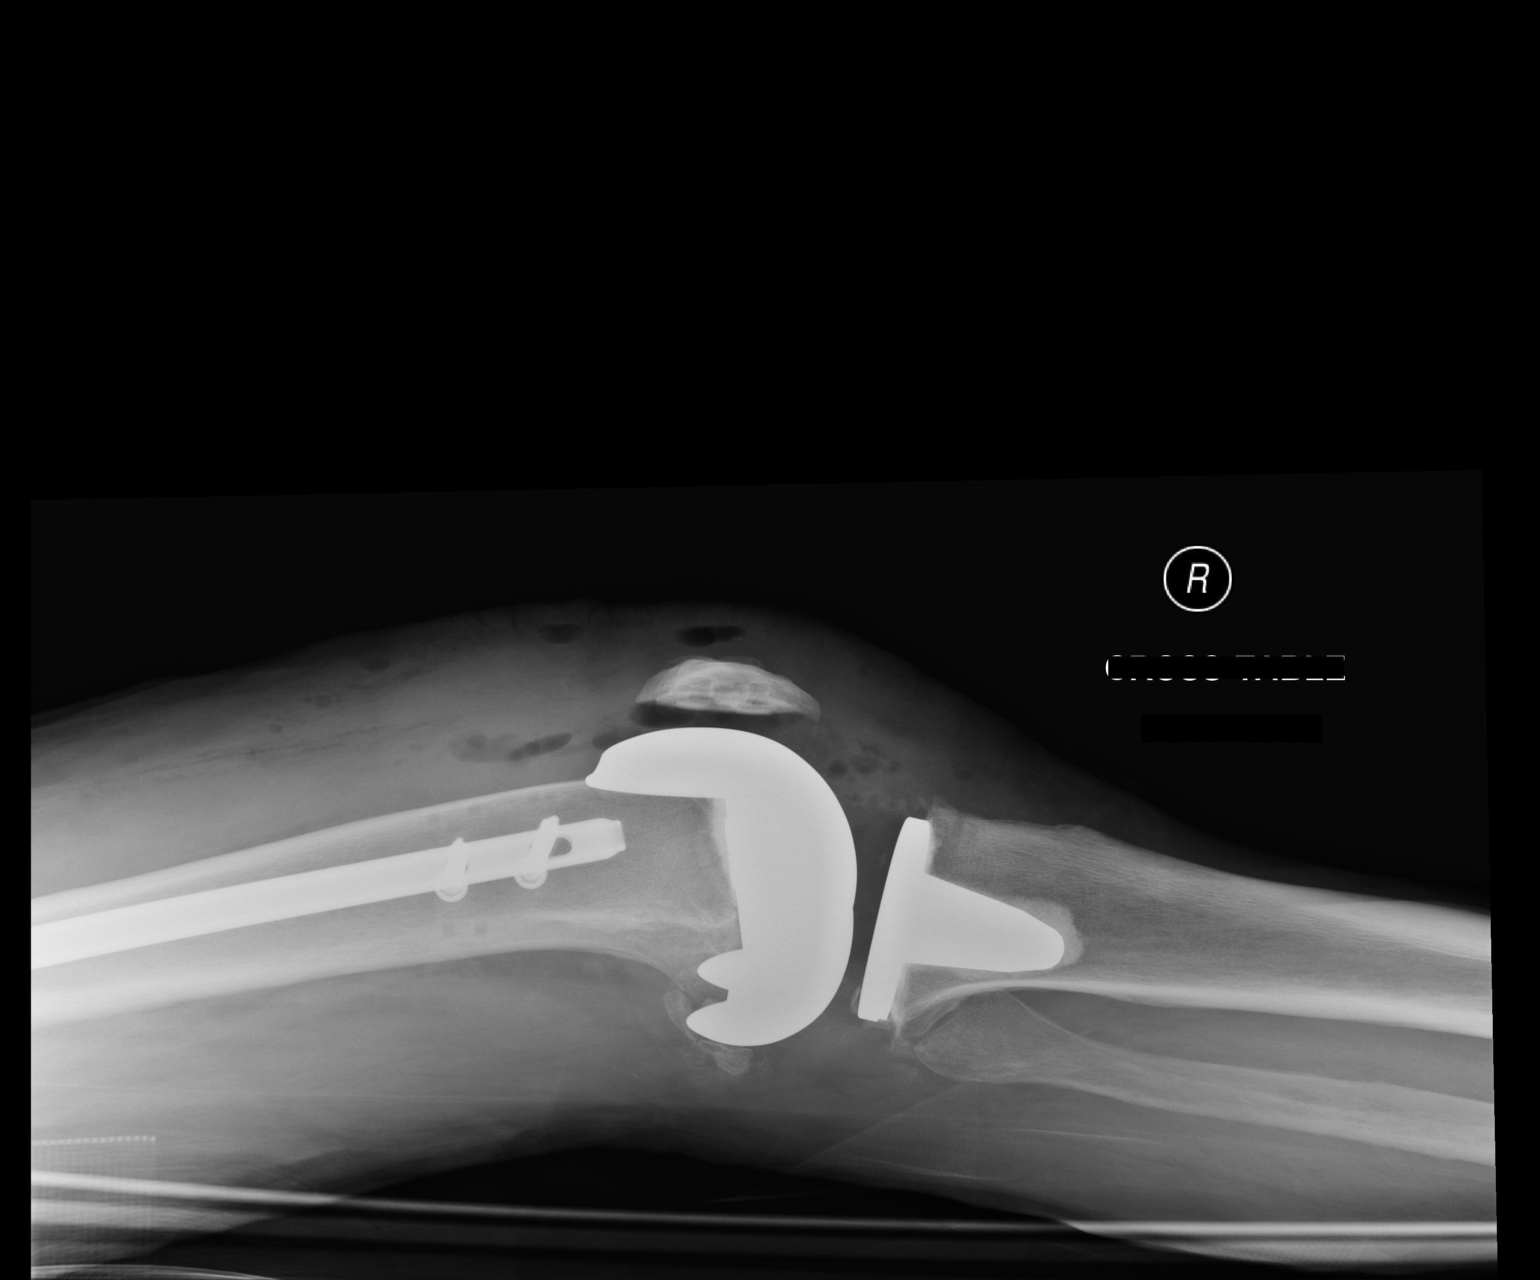

[2 of 2 positions shown; findings below may reference images not displayed]

FINDINGS: Total knee arthroplasty has been placed. Anatomic alignment of the
osseous and prostatic structures. No breakage or loosening of the
hardware. Existing intra medullary rod is stable in the right femur.
IMPRESSION: Total knee arthroplasty anatomically aligned.

## 2016-10-23 ENCOUNTER — Encounter (INDEPENDENT_AMBULATORY_CARE_PROVIDER_SITE_OTHER): Payer: Self-pay | Admitting: Orthopaedic Surgery

## 2016-10-23 ENCOUNTER — Ambulatory Visit (INDEPENDENT_AMBULATORY_CARE_PROVIDER_SITE_OTHER): Payer: Medicare Other | Admitting: Orthopaedic Surgery

## 2016-10-23 VITALS — BP 162/104 | HR 89 | Ht 71.0 in | Wt 185.0 lb

## 2016-10-23 DIAGNOSIS — Z96652 Presence of left artificial knee joint: Secondary | ICD-10-CM | POA: Diagnosis not present

## 2016-10-23 DIAGNOSIS — S20219D Contusion of unspecified front wall of thorax, subsequent encounter: Secondary | ICD-10-CM

## 2016-10-23 DIAGNOSIS — Z96651 Presence of right artificial knee joint: Secondary | ICD-10-CM | POA: Diagnosis not present
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
## 2016-11-16 ENCOUNTER — Telehealth (INDEPENDENT_AMBULATORY_CARE_PROVIDER_SITE_OTHER): Payer: Self-pay | Admitting: Orthopaedic Surgery

## 2016-11-16 NOTE — Telephone Encounter (Signed)
RECORDS 07/17/2016-PRESENT MAILED ABI.

## 2017-04-08 ENCOUNTER — Other Ambulatory Visit (INDEPENDENT_AMBULATORY_CARE_PROVIDER_SITE_OTHER): Payer: Self-pay | Admitting: Orthopaedic Surgery

## 2017-04-08 NOTE — Telephone Encounter (Signed)
Ok to refill 

## 2018-04-24 ENCOUNTER — Other Ambulatory Visit (INDEPENDENT_AMBULATORY_CARE_PROVIDER_SITE_OTHER): Payer: Self-pay | Admitting: Orthopaedic Surgery

## 2018-04-25 NOTE — Telephone Encounter (Signed)
Ok for refill? 

## 2019-05-15 ENCOUNTER — Other Ambulatory Visit (INDEPENDENT_AMBULATORY_CARE_PROVIDER_SITE_OTHER): Payer: Self-pay | Admitting: Orthopaedic Surgery

## 2019-05-15 NOTE — Telephone Encounter (Signed)
Please advise 

## 2019-08-25 ENCOUNTER — Other Ambulatory Visit (INDEPENDENT_AMBULATORY_CARE_PROVIDER_SITE_OTHER): Payer: Self-pay | Admitting: Orthopaedic Surgery

## 2019-08-26 NOTE — Telephone Encounter (Signed)
Please advise 

## 2019-10-05 ENCOUNTER — Ambulatory Visit: Payer: Self-pay | Admitting: General Surgery

## 2019-10-05 NOTE — H&P (Signed)
  The patient is a 64 year old male who presents with an inguinal hernia. Referred by: Dr. Jeffie Pollock Chief Complaint: Inguinal hernia  Patient is a 64 year old male who comes in with a history of gout, and a right inguinal hernia. Patient states that he is a visible bulge of the last 3-4 months only. He states he has some discomfort and pain at times. He states that he has difficulty with completion of urination as well as elevate the right hemiscrotum to help complete urination. He notices no pain to the left inguinal area. He's had no signs or symptoms of incarceration or strangulation. Patient recently underwent CT scan by Dr. Jeffie Pollock who was found to have a right inguinal hernia containing bladder as well as a small left inguinal hernia.  Patient had no previous abdominal surgery.    Allergies Emeline Gins, [place]; 10/05/2019 3:51 PM) No Known Drug Allergies [10/05/2019]: Allergies Reconciled   Medication History Emeline Gins, CMA; 10/05/2019 3:52 PM) Allopurinol (300MG  Tablet, Oral) Active. Pepcid (40MG  Tablet, Oral) Active. Medications Reconciled    Review of Systems Ralene Ok, MD; 10/05/2019 4:5 PM) All other systems negative  Vitals Emeline Gins CMA; 10/05/2019 3:51 PM) 10/05/2019 3:51 PM Weight: 200 lb Height: 70in Body Surface Area: 2.09 m Body Mass Index: 28.7 kg/m  Temp.: 98.14F  Pulse: 93 (Regular)  BP: 162/98 (Sitting, Left Arm, Standard)       Physical Exam Ralene Ok MD; 10/05/2019 4:05 PM) The physical exam findings are as follows: Note:Constitutional: No acute distress, conversant, appears stated age  Eyes: Anicteric sclerae, moist conjunctiva, no lid lag  Neck: No thyromegaly, trachea midline, no cervical lymphadenopathy  Lungs: Clear to auscultation biilaterally, normal respiratory effot  Cardiovascular: regular rate & rhythm, no murmurs, no peripheal edema, pedal pulses 2+  GI: Soft, no masses or hepatosplenomegaly,  non-tender to palpation  MSK: Normal gait, no clubbing cyanosis, edema  Skin: No rashes, palpation reveals normal skin turgor  Psychiatric: Appropriate judgment and insight, oriented to person, place, and time  Abdomen Inspection Hernias - Inguinal hernia - Bilateral - Reducible - Bilateral(Large right inguinal scrotal hernia, small left inguinal hernia).    Assessment & Plan Ralene Ok MD; 10/05/2019 4:05 PM) BILATERAL INGUINAL HERNIA WITHOUT OBSTRUCTION OR GANGRENE, RECURRENCE NOT SPECIFIED (K40.20) Impression: 64 year old male with a history of gout, with a large sliding right inguinal hernia as well as a left small inguinal hernia. 1. The patient will like to proceed to the operating room for laparoscopic bilateral inguinal hernia repair with mesh.  2. I discussed with the patient the signs and symptoms of incarceration and strangulation and the need to proceed to the ER should they occur.  3. I discussed with the patient the risks and benefits of the procedure to include but not limited to: Infection, bleeding, damage to surrounding structures, possible need for further surgery, possible nerve pain, and possible recurrence. The patient was understanding and wishes to proceed.

## 2019-10-05 NOTE — H&P (View-Only) (Signed)
  The patient is a 64 year old male who presents with an inguinal hernia. Referred by: Dr. Jeffie Pollock Chief Complaint: Inguinal hernia  Patient is a 64 year old male who comes in with a history of gout, and a right inguinal hernia. Patient states that he is a visible bulge of the last 3-4 months only. He states he has some discomfort and pain at times. He states that he has difficulty with completion of urination as well as elevate the right hemiscrotum to help complete urination. He notices no pain to the left inguinal area. He's had no signs or symptoms of incarceration or strangulation. Patient recently underwent CT scan by Dr. Jeffie Pollock who was found to have a right inguinal hernia containing bladder as well as a small left inguinal hernia.  Patient had no previous abdominal surgery.    Allergies Emeline Gins, [place]; 10/05/2019 3:51 PM) No Known Drug Allergies [10/05/2019]: Allergies Reconciled   Medication History Emeline Gins, CMA; 10/05/2019 3:52 PM) Allopurinol (300MG  Tablet, Oral) Active. Pepcid (40MG  Tablet, Oral) Active. Medications Reconciled    Review of Systems Ralene Ok, MD; 10/05/2019 4:5 PM) All other systems negative  Vitals Emeline Gins CMA; 10/05/2019 3:51 PM) 10/05/2019 3:51 PM Weight: 200 lb Height: 70in Body Surface Area: 2.09 m Body Mass Index: 28.7 kg/m  Temp.: 98.54F  Pulse: 93 (Regular)  BP: 162/98 (Sitting, Left Arm, Standard)       Physical Exam Ralene Ok MD; 10/05/2019 4:05 PM) The physical exam findings are as follows: Note:Constitutional: No acute distress, conversant, appears stated age  Eyes: Anicteric sclerae, moist conjunctiva, no lid lag  Neck: No thyromegaly, trachea midline, no cervical lymphadenopathy  Lungs: Clear to auscultation biilaterally, normal respiratory effot  Cardiovascular: regular rate & rhythm, no murmurs, no peripheal edema, pedal pulses 2+  GI: Soft, no masses or hepatosplenomegaly,  non-tender to palpation  MSK: Normal gait, no clubbing cyanosis, edema  Skin: No rashes, palpation reveals normal skin turgor  Psychiatric: Appropriate judgment and insight, oriented to person, place, and time  Abdomen Inspection Hernias - Inguinal hernia - Bilateral - Reducible - Bilateral(Large right inguinal scrotal hernia, small left inguinal hernia).    Assessment & Plan Ralene Ok MD; 10/05/2019 4:05 PM) BILATERAL INGUINAL HERNIA WITHOUT OBSTRUCTION OR GANGRENE, RECURRENCE NOT SPECIFIED (K40.20) Impression: 64 year old male with a history of gout, with a large sliding right inguinal hernia as well as a left small inguinal hernia. 1. The patient will like to proceed to the operating room for laparoscopic bilateral inguinal hernia repair with mesh.  2. I discussed with the patient the signs and symptoms of incarceration and strangulation and the need to proceed to the ER should they occur.  3. I discussed with the patient the risks and benefits of the procedure to include but not limited to: Infection, bleeding, damage to surrounding structures, possible need for further surgery, possible nerve pain, and possible recurrence. The patient was understanding and wishes to proceed.

## 2019-10-20 NOTE — Progress Notes (Addendum)
[id number], [place] [address] Phone: [phone] Fax: [facility] [address], [place] [address] Phone: [phone] Fax: [phone]      Your procedure is scheduled on Wednesday, February 24th.  Report to [facility] Main Entrance "A" at 6:30 A.M., and check in at the Admitting office.  Call this number if you have problems the morning of surgery:  [phone]  Call [phone] if you have any questions prior to your surgery date Monday-Friday 8am-4pm    Remember:  Do not eat or drink after midnight the night before your surgery    Take these medicines the morning of surgery with A SIP OF WATER  allopurinol (ZYLOPRIM)  diazepam (VALIUM)  omeprazole (PRILOSEC)   Follow your surgeon's instructions on when to stop Aspirin.  If no instructions were given by your surgeon then you will need to call the office to get those instructions.    As of today, STOP taking any Aspirin (unless otherwise instructed by your surgeon), Aleve, Naproxen, Ibuprofen, Motrin, Advil, Goody's, BC's, all herbal medications, fish oil, and all vitamins.    The Morning of Surgery  Do not wear jewelry.  Do not wear lotions, powders, or colognes, or deodorant  Men may shave face and neck.  Do not bring valuables to the hospital.  [facility] is not responsible for any belongings or valuables.  If you are a smoker, DO NOT Smoke 24 hours prior to surgery  If you wear a CPAP at night please bring your mask the morning of surgery   Remember that you must have someone to transport you home after your surgery, and remain with you for 24 hours if you are discharged the same day.   Please bring cases for contacts, glasses, hearing aids, dentures or bridgework because it cannot be worn into surgery.    Leave your suitcase in the car.  After  surgery it may be brought to your room.  For patients admitted to the hospital, discharge time will be determined by your treatment team.  Patients discharged the day of surgery will not be allowed to drive home.    Special instructions:   [facility]- Preparing For Surgery  Before surgery, you can play an important role. Because skin is not sterile, your skin needs to be as free of germs as possible. You can reduce the number of germs on your skin by washing with CHG (chlorahexidine gluconate) Soap before surgery.  CHG is an antiseptic cleaner which kills germs and bonds with the skin to continue killing germs even after washing.    Oral Hygiene is also important to reduce your risk of infection.  Remember - BRUSH YOUR TEETH THE MORNING OF SURGERY WITH YOUR REGULAR TOOTHPASTE  Please do not use if you have an allergy to CHG or antibacterial soaps. If your skin becomes reddened/irritated stop using the CHG.  Do not shave (including legs and underarms) for at least 48 hours prior to first CHG shower. It is OK to shave your face.  Please follow these instructions carefully.   1. Shower the NIGHT BEFORE SURGERY and the MORNING OF SURGERY with CHG Soap.   2. If you chose to wash your hair, wash your hair first as usual with your normal shampoo.  3. After you shampoo, rinse your hair and body thoroughly to remove the shampoo.  4. Use  CHG as you would any other liquid soap. You can apply CHG directly to the skin and wash gently with a scrungie or a clean washcloth.   5. Apply the CHG Soap to your body ONLY FROM THE NECK DOWN.  Do not use on open wounds or open sores. Avoid contact with your eyes, ears, mouth and genitals (private parts). Wash Face and genitals (private parts)  with your normal soap.   6. Wash thoroughly, paying special attention to the area where your surgery will be performed.  7. Thoroughly rinse your body with warm water from the neck down.  8. DO NOT shower/wash with  your normal soap after using and rinsing off the CHG Soap.  9. Pat yourself dry with a CLEAN TOWEL.  10. Wear CLEAN PAJAMAS to bed the night before surgery, wear comfortable clothes the morning of surgery  11. Place CLEAN SHEETS on your bed the night of your first shower and DO NOT SLEEP WITH PETS.    Day of Surgery:  Please shower the morning of surgery with the CHG soap Do not apply any deodorants/lotions. Please wear clean clothes to the hospital/surgery center.   Remember to brush your teeth WITH YOUR REGULAR TOOTHPASTE.   Please read over the following fact sheets that you were given.

## 2019-10-21 ENCOUNTER — Inpatient Hospital Stay (HOSPITAL_COMMUNITY)
Admission: RE | Admit: 2019-10-21 | Discharge: 2019-10-21 | Disposition: A | Discharge status: Home / Self Care | Payer: Self-pay | Source: Ambulatory Visit

## 2019-10-24 ENCOUNTER — Other Ambulatory Visit (HOSPITAL_COMMUNITY)
Admission: RE | Admit: 2019-10-24 | Discharge: 2019-10-24 | Disposition: A | Discharge status: Home / Self Care | Payer: Medicare HMO | Source: Ambulatory Visit | Attending: General Surgery | Admitting: General Surgery

## 2019-10-24 DIAGNOSIS — Z01812 Encounter for preprocedural laboratory examination: Secondary | ICD-10-CM | POA: Insufficient documentation

## 2019-10-24 DIAGNOSIS — Z20822 Contact with and (suspected) exposure to covid-19: Secondary | ICD-10-CM | POA: Diagnosis not present

## 2019-10-24 LAB — SARS CORONAVIRUS 2 (TAT 6-24 HRS): SARS Coronavirus 2: NEGATIVE

## 2019-10-27 ENCOUNTER — Encounter (HOSPITAL_COMMUNITY): Payer: Self-pay | Admitting: General Surgery

## 2019-10-27 ENCOUNTER — Other Ambulatory Visit: Payer: Self-pay

## 2019-10-27 NOTE — Progress Notes (Signed)
Pt denies SOB, chest pain, and being under the care of a cardiologist. Pt stated that PCP is Dr. Franki Monte. Pt denies having a stress test, echo and cardiac cath. Pt denies having an EKG and chest x ray . Pt denies recent labs. Pt made aware to stop taking Aspirin (unless otherwise advised by surgeon), vitamins, fish oil and herbal medications. Do not take any NSAIDs ie: Ibuprofen, Advil, Naproxen (Aleve), Motrin, BC and Goody Powder. Pt reminded to quarantine. Pt verbalized understanding of all pre-op instructions.

## 2019-10-27 NOTE — Anesthesia Preprocedure Evaluation (Addendum)
Anesthesia Evaluation  Patient identified by MRN, date of birth, ID band  Reviewed: Allergy & Precautions, NPO status , Patient's Chart, lab work & pertinent test results  Airway Mallampati: I  TM Distance: >3 FB Neck ROM: Full    Dental no notable dental hx. (+) Edentulous Lower, Edentulous Upper   Pulmonary neg pulmonary ROS,    Pulmonary exam normal breath sounds clear to auscultation       Cardiovascular Exercise Tolerance: Good Normal cardiovascular exam Rhythm:Regular Rate:Normal     Neuro/Psych PSYCHIATRIC DISORDERS negative neurological ROS     GI/Hepatic Neg liver ROS, GERD  Medicated,  Endo/Other  negative endocrine ROS  Renal/GU negative Renal ROS     Musculoskeletal  (+) Arthritis , Gout   Abdominal   Peds  Hematology   Anesthesia Other Findings   Reproductive/Obstetrics                            Anesthesia Physical Anesthesia Plan  ASA: II  Anesthesia Plan: General   Post-op Pain Management:    Induction: Intravenous  PONV Risk Score and Plan: 3 and Treatment may vary due to age or medical condition, Ondansetron and Dexamethasone  Airway Management Planned: Oral ETT  Additional Equipment: None  Intra-op Plan:   Post-operative Plan: Extubation in OR  Informed Consent: I have reviewed the patients History and Physical, chart, labs and discussed the procedure including the risks, benefits and alternatives for the proposed anesthesia with the patient or authorized representative who has indicated his/her understanding and acceptance.     Dental advisory given  Plan Discussed with: CRNA  Anesthesia Plan Comments:        Anesthesia Quick Evaluation

## 2019-10-28 ENCOUNTER — Encounter (HOSPITAL_COMMUNITY): Payer: Self-pay | Admitting: General Surgery

## 2019-10-28 ENCOUNTER — Ambulatory Visit (HOSPITAL_COMMUNITY): Payer: Medicare HMO | Admitting: Anesthesiology

## 2019-10-28 ENCOUNTER — Observation Stay (HOSPITAL_COMMUNITY)
Admission: RE | Admit: 2019-10-28 | Discharge: 2019-10-29 | Disposition: A | Discharge status: Home / Self Care | Payer: Medicare HMO | Attending: General Surgery | Admitting: General Surgery

## 2019-10-28 ENCOUNTER — Encounter (HOSPITAL_COMMUNITY)
Admission: RE | Disposition: A | Discharge status: Home / Self Care | Payer: Self-pay | Source: Home / Self Care | Attending: General Surgery

## 2019-10-28 DIAGNOSIS — K4020 Bilateral inguinal hernia, without obstruction or gangrene, not specified as recurrent: Principal | ICD-10-CM | POA: Insufficient documentation

## 2019-10-28 DIAGNOSIS — Z79899 Other long term (current) drug therapy: Secondary | ICD-10-CM | POA: Diagnosis not present

## 2019-10-28 DIAGNOSIS — Z8719 Personal history of other diseases of the digestive system: Secondary | ICD-10-CM

## 2019-10-28 DIAGNOSIS — M109 Gout, unspecified: Secondary | ICD-10-CM | POA: Insufficient documentation

## 2019-10-28 DIAGNOSIS — Z7982 Long term (current) use of aspirin: Secondary | ICD-10-CM | POA: Diagnosis not present

## 2019-10-28 DIAGNOSIS — Z9889 Other specified postprocedural states: Secondary | ICD-10-CM

## 2019-10-28 DIAGNOSIS — K219 Gastro-esophageal reflux disease without esophagitis: Secondary | ICD-10-CM | POA: Insufficient documentation

## 2019-10-28 DIAGNOSIS — D176 Benign lipomatous neoplasm of spermatic cord: Secondary | ICD-10-CM | POA: Insufficient documentation

## 2019-10-28 HISTORY — PX: INGUINAL HERNIA REPAIR: SHX194

## 2019-10-28 HISTORY — DX: Presence of spectacles and contact lenses: Z97.3

## 2019-10-28 HISTORY — PX: LAPAROSCOPIC INGUINAL HERNIA REPAIR: SUR788

## 2019-10-28 HISTORY — DX: Bilateral inguinal hernia, without obstruction or gangrene, not specified as recurrent: K40.20

## 2019-10-28 LAB — POCT I-STAT, CHEM 8
BUN: 14 mg/dL (ref 8–23)
Calcium, Ion: 0.98 mmol/L — ABNORMAL LOW (ref 1.15–1.40)
Chloride: 107 mmol/L (ref 98–111)
Creatinine, Ser: 0.9 mg/dL (ref 0.61–1.24)
Glucose, Bld: 88 mg/dL (ref 70–99)
HCT: 48 % (ref 39.0–52.0)
Hemoglobin: 16.3 g/dL (ref 13.0–17.0)
Potassium: 3.9 mmol/L (ref 3.5–5.1)
Sodium: 138 mmol/L (ref 135–145)
TCO2: 26 mmol/L (ref 22–32)

## 2019-10-28 SURGERY — REPAIR, HERNIA, INGUINAL, BILATERAL, LAPAROSCOPIC
Anesthesia: General | Site: Inguinal | Laterality: Right

## 2019-10-28 MED ORDER — PHENYLEPHRINE 40 MCG/ML (10ML) SYRINGE FOR IV PUSH (FOR BLOOD PRESSURE SUPPORT)
PREFILLED_SYRINGE | INTRAVENOUS | Status: AC
  Filled 2019-10-28: qty 10

## 2019-10-28 MED ORDER — OXYCODONE HCL 5 MG PO TABS
ORAL_TABLET | ORAL | Status: AC
  Filled 2019-10-28: qty 2

## 2019-10-28 MED ORDER — CEFAZOLIN SODIUM-DEXTROSE 2-4 GM/100ML-% IV SOLN
2.0000 g | INTRAVENOUS | Status: AC
  Administered 2019-10-28: 2 g via INTRAVENOUS
  Filled 2019-10-28: qty 100

## 2019-10-28 MED ORDER — 0.9 % SODIUM CHLORIDE (POUR BTL) OPTIME
TOPICAL | Status: DC | PRN
  Administered 2019-10-28: 1000 mL

## 2019-10-28 MED ORDER — PROPOFOL 10 MG/ML IV BOLUS
INTRAVENOUS | Status: AC
  Filled 2019-10-28: qty 20

## 2019-10-28 MED ORDER — CHLORHEXIDINE GLUCONATE CLOTH 2 % EX PADS: 6.0000 | MEDICATED_PAD | Freq: Once | CUTANEOUS | Status: DC

## 2019-10-28 MED ORDER — LACTATED RINGERS IV SOLN: INTRAVENOUS | Status: DC | PRN

## 2019-10-28 MED ORDER — ONDANSETRON 4 MG PO TBDP: 4.0000 mg | ORAL_TABLET | Freq: Four times a day (QID) | ORAL | Status: DC | PRN

## 2019-10-28 MED ORDER — OXYCODONE HCL 5 MG/5ML PO SOLN: 5.0000 mg | Freq: Once | ORAL | Status: DC | PRN

## 2019-10-28 MED ORDER — MIDAZOLAM HCL 5 MG/5ML IJ SOLN
INTRAMUSCULAR | Status: DC | PRN
  Administered 2019-10-28: 2 mg via INTRAVENOUS

## 2019-10-28 MED ORDER — MIDAZOLAM HCL 2 MG/2ML IJ SOLN
INTRAMUSCULAR | Status: AC
  Filled 2019-10-28: qty 2

## 2019-10-28 MED ORDER — DEXTROSE-NACL 5-0.9 % IV SOLN: INTRAVENOUS | Status: DC

## 2019-10-28 MED ORDER — HYDROMORPHONE HCL 1 MG/ML IJ SOLN
INTRAMUSCULAR | Status: AC
  Administered 2019-10-28: 0.25 mg via INTRAVENOUS
  Filled 2019-10-28: qty 1

## 2019-10-28 MED ORDER — HYDROMORPHONE HCL 1 MG/ML IJ SOLN
0.2500 mg | INTRAMUSCULAR | Status: DC | PRN
  Administered 2019-10-28: 0.5 mg via INTRAVENOUS
  Administered 2019-10-28: 0.25 mg via INTRAVENOUS

## 2019-10-28 MED ORDER — PHENYLEPHRINE HCL-NACL 10-0.9 MG/250ML-% IV SOLN
INTRAVENOUS | Status: DC | PRN
  Administered 2019-10-28: 20 ug/min via INTRAVENOUS

## 2019-10-28 MED ORDER — FENTANYL CITRATE (PF) 100 MCG/2ML IJ SOLN
INTRAMUSCULAR | Status: DC | PRN
  Administered 2019-10-28: 50 ug via INTRAVENOUS
  Administered 2019-10-28: 100 ug via INTRAVENOUS
  Administered 2019-10-28: 50 ug via INTRAVENOUS

## 2019-10-28 MED ORDER — FENTANYL CITRATE (PF) 250 MCG/5ML IJ SOLN
INTRAMUSCULAR | Status: AC
  Filled 2019-10-28: qty 5

## 2019-10-28 MED ORDER — ALLOPURINOL 300 MG PO TABS
300.0000 mg | ORAL_TABLET | Freq: Every day | ORAL | Status: DC
  Administered 2019-10-29: 300 mg via ORAL
  Filled 2019-10-28: qty 1

## 2019-10-28 MED ORDER — SUGAMMADEX SODIUM 200 MG/2ML IV SOLN
INTRAVENOUS | Status: DC | PRN
  Administered 2019-10-28: 200 mg via INTRAVENOUS

## 2019-10-28 MED ORDER — HYDROMORPHONE HCL 1 MG/ML IJ SOLN: 1.0000 mg | INTRAMUSCULAR | Status: DC | PRN

## 2019-10-28 MED ORDER — TAMSULOSIN HCL 0.4 MG PO CAPS
0.4000 mg | ORAL_CAPSULE | Freq: Two times a day (BID) | ORAL | Status: DC
  Administered 2019-10-28 – 2019-10-29 (×2): 0.4 mg via ORAL
  Filled 2019-10-28 (×2): qty 1

## 2019-10-28 MED ORDER — ONDANSETRON HCL 4 MG/2ML IJ SOLN
INTRAMUSCULAR | Status: AC
  Filled 2019-10-28: qty 2

## 2019-10-28 MED ORDER — ACETAMINOPHEN 500 MG PO TABS
1000.0000 mg | ORAL_TABLET | ORAL | Status: AC
  Administered 2019-10-28: 1000 mg via ORAL
  Filled 2019-10-28: qty 2

## 2019-10-28 MED ORDER — ONDANSETRON HCL 4 MG/2ML IJ SOLN: 4.0000 mg | Freq: Once | INTRAMUSCULAR | Status: DC | PRN

## 2019-10-28 MED ORDER — OXYCODONE HCL 5 MG PO TABS: 5.0000 mg | ORAL_TABLET | Freq: Once | ORAL | Status: DC | PRN

## 2019-10-28 MED ORDER — BUPIVACAINE HCL (PF) 0.25 % IJ SOLN
INTRAMUSCULAR | Status: AC
  Filled 2019-10-28: qty 30

## 2019-10-28 MED ORDER — ONDANSETRON HCL 4 MG/2ML IJ SOLN: 4.0000 mg | Freq: Four times a day (QID) | INTRAMUSCULAR | Status: DC | PRN

## 2019-10-28 MED ORDER — TRAMADOL HCL 50 MG PO TABS
50.0000 mg | ORAL_TABLET | Freq: Four times a day (QID) | ORAL | Status: DC | PRN
  Administered 2019-10-28 – 2019-10-29 (×2): 50 mg via ORAL
  Filled 2019-10-28 (×2): qty 1

## 2019-10-28 MED ORDER — BUPIVACAINE HCL (PF) 0.25 % IJ SOLN
INTRAMUSCULAR | Status: DC | PRN
  Administered 2019-10-28: 18 mL

## 2019-10-28 MED ORDER — KETOROLAC TROMETHAMINE 30 MG/ML IJ SOLN
30.0000 mg | Freq: Once | INTRAMUSCULAR | Status: AC | PRN
  Administered 2019-10-28: 30 mg via INTRAVENOUS

## 2019-10-28 MED ORDER — OXYCODONE HCL 5 MG PO TABS
5.0000 mg | ORAL_TABLET | ORAL | Status: DC | PRN
  Administered 2019-10-28: 10 mg via ORAL
  Administered 2019-10-29: 5 mg via ORAL
  Filled 2019-10-28: qty 1

## 2019-10-28 MED ORDER — AMPHETAMINE-DEXTROAMPHETAMINE 10 MG PO TABS: 20.0000 mg | ORAL_TABLET | Freq: Two times a day (BID) | ORAL | Status: DC | PRN

## 2019-10-28 MED ORDER — KETOROLAC TROMETHAMINE 30 MG/ML IJ SOLN
INTRAMUSCULAR | Status: AC
  Filled 2019-10-28: qty 1

## 2019-10-28 MED ORDER — PROPOFOL 10 MG/ML IV BOLUS
INTRAVENOUS | Status: DC | PRN
  Administered 2019-10-28: 170 mg via INTRAVENOUS

## 2019-10-28 MED ORDER — ROCURONIUM BROMIDE 50 MG/5ML IV SOSY
PREFILLED_SYRINGE | INTRAVENOUS | Status: DC | PRN
  Administered 2019-10-28 (×3): 20 mg via INTRAVENOUS
  Administered 2019-10-28: 70 mg via INTRAVENOUS

## 2019-10-28 MED ORDER — DEXAMETHASONE SODIUM PHOSPHATE 10 MG/ML IJ SOLN
INTRAMUSCULAR | Status: DC | PRN
  Administered 2019-10-28: 5 mg via INTRAVENOUS

## 2019-10-28 MED ORDER — ONDANSETRON HCL 4 MG/2ML IJ SOLN
INTRAMUSCULAR | Status: DC | PRN
  Administered 2019-10-28: 4 mg via INTRAVENOUS

## 2019-10-28 MED ORDER — LIDOCAINE 2% (20 MG/ML) 5 ML SYRINGE
INTRAMUSCULAR | Status: DC | PRN
  Administered 2019-10-28: 100 mg via INTRAVENOUS

## 2019-10-28 SURGICAL SUPPLY — 58 items
APPLIER CLIP LOGIC TI 5 (MISCELLANEOUS) IMPLANT
BLADE SURG 10 STRL SS (BLADE) ×1 IMPLANT
CANISTER SUCT 3000ML PPV (MISCELLANEOUS) ×1 IMPLANT
CHLORAPREP W/TINT 26 (MISCELLANEOUS) ×3 IMPLANT
COVER SURGICAL LIGHT HANDLE (MISCELLANEOUS) ×3 IMPLANT
COVER WAND RF STERILE (DRAPES) ×3 IMPLANT
DERMABOND ADVANCED (GAUZE/BANDAGES/DRESSINGS) ×2
DERMABOND ADVANCED .7 DNX12 (GAUZE/BANDAGES/DRESSINGS) ×2 IMPLANT
DISSECTOR BLUNT TIP ENDO 5MM (MISCELLANEOUS) IMPLANT
DRAIN PENROSE 0.5X18 (DRAIN) ×1 IMPLANT
DRAPE LAPAROSCOPIC ABDOMINAL (DRAPES) ×3 IMPLANT
ELECT CAUTERY BLADE 6.4 (BLADE) ×1 IMPLANT
ELECT REM PT RETURN 9FT ADLT (ELECTROSURGICAL) ×3
ELECTRODE REM PT RTRN 9FT ADLT (ELECTROSURGICAL) ×2 IMPLANT
ENDOLOOP SUT PDS II  0 18 (SUTURE) ×1
ENDOLOOP SUT PDS II 0 18 (SUTURE) IMPLANT
GLOVE BIO SURGEON STRL SZ7.5 (GLOVE) ×3 IMPLANT
GOWN STRL REUS W/ TWL LRG LVL3 (GOWN DISPOSABLE) ×4 IMPLANT
GOWN STRL REUS W/ TWL XL LVL3 (GOWN DISPOSABLE) ×2 IMPLANT
GOWN STRL REUS W/TWL LRG LVL3 (GOWN DISPOSABLE) ×2
GOWN STRL REUS W/TWL XL LVL3 (GOWN DISPOSABLE) ×1
KIT BASIN OR (CUSTOM PROCEDURE TRAY) ×3 IMPLANT
KIT TURNOVER KIT B (KITS) ×3 IMPLANT
MESH 3DMAX 4X6 LT LRG (Mesh General) ×1 IMPLANT
MESH PARIETEX PROGRIP RIGHT (Mesh General) ×1 IMPLANT
NDL INSUFFLATION 14GA 120MM (NEEDLE) IMPLANT
NEEDLE INSUFFLATION 14GA 120MM (NEEDLE) IMPLANT
NS IRRIG 1000ML POUR BTL (IV SOLUTION) ×3 IMPLANT
PAD ARMBOARD 7.5X6 YLW CONV (MISCELLANEOUS) ×6 IMPLANT
RELOAD STAPLE 4.0 BLU F/HERNIA (INSTRUMENTS) ×2 IMPLANT
RELOAD STAPLE 4.8 BLK F/HERNIA (STAPLE) IMPLANT
RELOAD STAPLE HERNIA 4.0 BLUE (INSTRUMENTS) IMPLANT
RELOAD STAPLE HERNIA 4.8 BLK (STAPLE) ×3 IMPLANT
SCISSORS LAP 5X35 DISP (ENDOMECHANICALS) ×3 IMPLANT
SET IRRIG TUBING LAPAROSCOPIC (IRRIGATION / IRRIGATOR) ×1 IMPLANT
SET TUBE SMOKE EVAC HIGH FLOW (TUBING) ×3 IMPLANT
STAPLER HERNIA 12 8.5 360D (INSTRUMENTS) ×3 IMPLANT
SUT MNCRL AB 4-0 PS2 18 (SUTURE) ×4 IMPLANT
SUT PROLENE 2 0 CT2 30 (SUTURE) ×2 IMPLANT
SUT VIC AB 1 CT1 27 (SUTURE) ×1
SUT VIC AB 1 CT1 27XBRD ANBCTR (SUTURE) IMPLANT
SUT VIC AB 2-0 CT1 27 (SUTURE) ×1
SUT VIC AB 2-0 CT1 TAPERPNT 27 (SUTURE) IMPLANT
SUT VIC AB 3-0 SH 27 (SUTURE) ×1
SUT VIC AB 3-0 SH 27XBRD (SUTURE) IMPLANT
SUT VICRYL AB 3 0 TIES (SUTURE) ×1 IMPLANT
SYR TOOMEY 50ML (SYRINGE) ×1 IMPLANT
TOWEL GREEN STERILE (TOWEL DISPOSABLE) ×3 IMPLANT
TOWEL GREEN STERILE FF (TOWEL DISPOSABLE) ×3 IMPLANT
TRAY FOLEY W/BAG SLVR 16FR (SET/KITS/TRAYS/PACK) ×1
TRAY FOLEY W/BAG SLVR 16FR ST (SET/KITS/TRAYS/PACK) ×2 IMPLANT
TRAY LAPAROSCOPIC MC (CUSTOM PROCEDURE TRAY) ×3 IMPLANT
TROCAR OPTICAL SHORT 5MM (TROCAR) ×3 IMPLANT
TROCAR OPTICAL SLV SHORT 5MM (TROCAR) ×3 IMPLANT
TROCAR XCEL 12X100 BLDLESS (ENDOMECHANICALS) ×3 IMPLANT
TUBE CONNECTING 12X1/4 (SUCTIONS) ×1 IMPLANT
WATER STERILE IRR 1000ML POUR (IV SOLUTION) ×3 IMPLANT
YANKAUER SUCT BULB TIP NO VENT (SUCTIONS) ×1 IMPLANT

## 2019-10-28 NOTE — Anesthesia Postprocedure Evaluation (Signed)
Anesthesia Post Note  Patient: Chad Roth  Procedure(s) Performed: LAPAROSCOPIC LEFT INGUINAL HERNIA REPAIR WITH MESH (Left Inguinal) RIGHT Hernia Repair with mesh Inguinal Adult (Right Inguinal)     Patient location during evaluation: PACU Anesthesia Type: General Level of consciousness: awake and alert Pain management: pain level controlled Vital Signs Assessment: post-procedure vital signs reviewed and stable Respiratory status: spontaneous breathing, nonlabored ventilation, respiratory function stable and patient connected to nasal cannula oxygen Cardiovascular status: blood pressure returned to baseline and stable Postop Assessment: no apparent nausea or vomiting Anesthetic complications: no    Last Vitals:  Vitals:   10/28/19 1035 10/28/19 1040  BP: (!) 132/92   Pulse: 79 82  Resp: 14 14  Temp:    SpO2: 94% 94%    Last Pain:  Vitals:   10/28/19 1040  PainSc: 8                  Barnet Glasgow

## 2019-10-28 NOTE — Interval H&P Note (Signed)
History and Physical Interval Note:  10/28/2019 6:59 AM  Chad Roth  has presented today for surgery, with the diagnosis of SLIDING BILATERAL HERNIA.  The various methods of treatment have been discussed with the patient and family. After consideration of risks, benefits and other options for treatment, the patient has consented to  Procedure(s): LAPAROSCOPIC BILATERAL INGUINAL HERNIA REPAIR WITH MESH (Bilateral) as a surgical intervention.  The patient's history has been reviewed, patient examined, no change in status, stable for surgery.  I have reviewed the patient's chart and labs.  Questions were answered to the patient's satisfaction.     Ralene Ok

## 2019-10-28 NOTE — Anesthesia Procedure Notes (Signed)
Procedure Name: Intubation Date/Time: 10/28/2019 7:51 AM Performed by: Inda Coke, CRNA Pre-anesthesia Checklist: Patient identified, Emergency Drugs available, Suction available and Patient being monitored Patient Re-evaluated:Patient Re-evaluated prior to induction Oxygen Delivery Method: Circle System Utilized Preoxygenation: Pre-oxygenation with 100% oxygen Induction Type: IV induction Ventilation: Mask ventilation without difficulty and Oral airway inserted - appropriate to patient size Laryngoscope Size: Mac and 4 Grade View: Grade I Tube type: Oral Tube size: 7.5 mm Number of attempts: 1 Airway Equipment and Method: Stylet and Oral airway Placement Confirmation: ETT inserted through vocal cords under direct vision,  positive ETCO2 and breath sounds checked- equal and bilateral Secured at: 22 cm Tube secured with: Tape Dental Injury: Teeth and Oropharynx as per pre-operative assessment

## 2019-10-28 NOTE — Op Note (Signed)
10/28/2019  9:49 AM  PATIENT:  Mathis Bud  64 y.o. male  PRE-OPERATIVE DIAGNOSIS:  SLIDING BILATERAL HERNIA  POST-OPERATIVE DIAGNOSIS:  SLIDING BILATERAL HERNIA  PROCEDURE:  Procedure(s): LAPAROSCOPIC LEFT INGUINAL HERNIA REPAIR WITH MESH (Left) OPEN RIGHT Hernia Repair Inguinal Adult (Right)  SURGEON:  Surgeon(s) and Role:    Ralene Ok, MD - Primary   ASSISTANTS: Ewell Poe, RNFA   ANESTHESIA:   local and general  EBL:  minimal   BLOOD ADMINISTERED:none  DRAINS: none   LOCAL MEDICATIONS USED:  BUPIVICAINE   SPECIMEN:  No Specimen  DISPOSITION OF SPECIMEN:  N/A  COUNTS:  YES  TOURNIQUET:  * No tourniquets in log *  DICTATION: .Dragon Dictation Counts: reported as correct x 2   Findings:  The patient had a moderate sized left indirect hernia and a moderated sized cord lipoma   Indications for procedure:  The patient is a 64 year old male with a bilateral inguinal hernias with a sliding component containing bladder on the right side for several months. Patient complained of symptomatology to the bilateral inguinal areas. The patient was taken back for elective inguinal hernia repair.   Details of the procedure: The patient was taken back to the operating room. The patient was placed in supine position with bilateral SCDs in place.  The patient was prepped and draped in the usual sterile fashion.  After appropriate anitbiotics were confirmed, a time-out was confirmed and all facts were verified.   0.25% Marcaine was used to infiltrate the umbilical area. A 11-blade was used to cut down the skin and blunt dissection was used to get the anterior fashion.  The anterior fascia was incised approximately 1 cm and the muscles were retracted laterally. Blunt dissection was then used to create a space in the preperitoneal area. At this time a 10 mm camera was then introduced into the space and advanced the pubic tubercle and a 12 mm trocar was placed over this and  insufflation was started.  At this time and space was created from medial to laterally the preperitoneal space.  Cooper's ligament was initially cleaned off.  The hernia sac was identified in the indirect space. Dissection of the hernia sac and cord structures was undertaken the vas deferens was identified and protected in all parts of the case.  Large cord lipoma was also reduced.   Once the hernia sac was taken down to approximately the umbilicus a Left Bard 3D Max mesh, size: Rachelle Hora, was  introduced into the preperitoneal space.  The mesh was brought over to cover the direct and indirect hernia spaces.  This was anchored into place and secured to Cooper's ligament with 4.8mm staples from a Coviden hernia stapler. It was anchored to the anterior abdominal wall with 4.8 mm staples. The hernia sac was seen lying posterior to the mesh. There was no staples placed laterally.   This time instrumentation to the right-sided hernia.  There is large amount of herniated contents which appear to be in the indirect or possible direct area.  It was difficult to assess secondary to the amount of herniated contents.  I proceeded to circumferentially dissect this area.  This was difficult to reduce after approximately 45 minutes of dissection an attempt to reduce the hernia I decided to convert to open hernia.  A 5 cm incision was made just 1 cm superior to the inguinal ligament. Bovie cautery was used to maintain hemostasis dissection is carried down to the external oblique that was very atretic.  The hernia was easily visualized.  I circumferentially dissect this around. The ilioinguinal nerve was identified and ligated with an 2-0 dyed vicryl.   The spermatic cord and the hernia were then bluntly dissected away from the pubic tubercle and a Penrose was placed around the hernia sac in the spermatic cord. The vas deferens was identified and protected at all portions of the case. Dissection of the cremasterics took place  with Bovie cautery.  From this perspective the hernia appeared to be direct in nature.  The bladder and its contents were reduced.  The hernia sac was not entered.  There was a large fatty core/cord lipoma was also reduced.    At this time a right-sided Progrip mesh was then anchored to the pubic tubercle with a 2-0 Prolene.  It was anchored to what I felt was the shelving edge of the external oblique x 1 and the conjoint tendon cephalad x 1.  The wrap around of the mesh was sutured to the conjoint tendon as well.  The new internal ring did not strangulate the spermatic cord.   The tail was then tucked under the external oblique. At this time the area was irrigated out with sterile saline.    The external oblique was reapproximated using a 2-0 Vicryl in a running fashion. Scarpa's fascia was then reapproximated using a 3-0 Vicryl running fashion. The skin was then reapproximated with 4 Monocryl in a subcuticular fashion. The skin was then dressed with Dermabond.  The patient was taken to the recovery room in stable condition.  The anterior fascia was reapproximated using #1 Vicryl on a UR- 6.  Intra-abdominal air was evacuated and the Veress needle removed. The skin was reapproximated using 4-0 Monocryl subcuticular fashion and Dermabond. The patient was awakened from general anesthesia and taken to recovery in stable condition.   PLAN OF CARE: Admit for overnight observation  PATIENT DISPOSITION:  PACU - hemodynamically stable.   Delay start of Pharmacological VTE agent (>24hrs) due to surgical blood loss or risk of bleeding: not applicable

## 2019-10-28 NOTE — Progress Notes (Signed)
Dr. Valma Cava made aware of patient's blood pressure.  Patient states that he lives alone and does not have anyone to stay with him. Dr. Rosendo Gros is aware and states that he will keep the patient over night

## 2019-10-28 NOTE — Discharge Instructions (Signed)
[facility] _______[facility] ° °UMBILICAL OR INGUINAL HERNIA REPAIR: POST OP INSTRUCTIONS ° °Always review your discharge instruction sheet given to you by the facility where your surgery was performed. °IF YOU HAVE DISABILITY OR FAMILY LEAVE FORMS, YOU MUST BRING THEM TO THE OFFICE FOR PROCESSING.   °DO NOT GIVE THEM TO YOUR DOCTOR. ° °1. A  prescription for pain medication may be given to you upon discharge.  Take your pain medication as prescribed, if needed.  If narcotic pain medicine is not needed, then you may take acetaminophen (Tylenol) or ibuprofen (Advil) as needed. °2. Take your usually prescribed medications unless otherwise directed. °If you need a refill on your pain medication, please contact your pharmacy.  They will contact our office to request authorization. Prescriptions will not be filled after 5 pm or on week-ends. °3. You should follow a light diet the first 24 hours after arrival home, such as soup and crackers, etc.  Be sure to include lots of fluids daily.  Resume your normal diet the day after surgery. °4.Most patients will experience some swelling and bruising around the umbilicus or in the groin and scrotum.  Ice packs and reclining will help.  Swelling and bruising can take several days to resolve.  °6. It is common to experience some constipation if taking pain medication after surgery.  Increasing fluid intake and taking a stool softener (such as Colace) will usually help or prevent this problem from occurring.  A mild laxative (Milk of Magnesia or Miralax) should be taken according to package directions if there are no bowel movements after 48 hours. °7. Unless discharge instructions indicate otherwise, you may remove your bandages 24-48 hours after surgery, and you may shower at that time.  You may have steri-strips (small skin tapes) in place directly over the incision.  These strips should be left on the skin for 7-10 days.  If your surgeon used skin glue on the  incision, you may shower in 24 hours.  The glue will flake off over the next 2-3 weeks.  Any sutures or staples will be removed at the office during your follow-up visit. °8. ACTIVITIES:  You may resume regular (light) daily activities beginning the next day--such as daily self-care, walking, climbing stairs--gradually increasing activities as tolerated.  You may have sexual intercourse when it is comfortable.  Refrain from any heavy lifting or straining until approved by your doctor. ° °a.You may drive when you are no longer taking prescription pain medication, you can comfortably wear a seatbelt, and you can safely maneuver your car and apply brakes. °b.RETURN TO WORK:   °_____________________________________________ ° °9.You should see your doctor in the office for a follow-up appointment approximately 2-3 weeks after your surgery.  Make sure that you call for this appointment within a day or two after you arrive home to insure a convenient appointment time. °10.OTHER INSTRUCTIONS: _________________________ °   _____________________________________ ° °WHEN TO CALL YOUR DOCTOR: °1. Fever over 101.0 °2. Inability to urinate °3. Nausea and/or vomiting °4. Extreme swelling or bruising °5. Continued bleeding from incision. °6. Increased pain, redness, or drainage from the incision ° °The clinic staff is available to answer your questions during regular business hours.  Please don’t hesitate to call and ask to speak to one of the nurses for clinical concerns.  If you have a medical emergency, go to the nearest emergency room or call 911.  A surgeon from [facility] is always on call at the hospital ° ° °  [address], [address], [place]  [address] ? ° [address] [address], [place],    [address] °[phone] ? [phone] ? FAX [phone] °Web site: [email or web address] °

## 2019-10-28 NOTE — Transfer of Care (Signed)
Immediate Anesthesia Transfer of Care Note  Patient: Chad Roth  Procedure(s) Performed: LAPAROSCOPIC LEFT INGUINAL HERNIA REPAIR WITH MESH (Left Inguinal) RIGHT Hernia Repair with mesh Inguinal Adult (Right Inguinal)  Patient Location: PACU  Anesthesia Type:General  Level of Consciousness: awake, alert  and oriented  Airway & Oxygen Therapy: Patient Spontanous Breathing and Patient connected to face mask oxygen  Post-op Assessment: Report given to RN and Post -op Vital signs reviewed and stable  Post vital signs: Reviewed and stable  Last Vitals:  Vitals Value Taken Time  BP 144/93 10/28/19 1020  Temp 37.1 C 10/28/19 1020  Pulse 85 10/28/19 1020  Resp 14 10/28/19 1020  SpO2 91 % 10/28/19 1020  Vitals shown include unvalidated device data.  Last Pain:  Vitals:   10/28/19 0725  PainSc: 0-No pain      Patients Stated Pain Goal: 3 ([id number])  Complications: No apparent anesthesia complications

## 2019-10-29 ENCOUNTER — Encounter: Payer: Self-pay | Admitting: *Deleted

## 2019-10-29 DIAGNOSIS — K4020 Bilateral inguinal hernia, without obstruction or gangrene, not specified as recurrent: Secondary | ICD-10-CM | POA: Diagnosis not present

## 2019-10-29 MED ORDER — TRAMADOL HCL 50 MG PO TABS: 50.0000 mg | ORAL_TABLET | Freq: Four times a day (QID) | ORAL | 0 refills | Status: DC | PRN

## 2019-10-29 NOTE — Discharge Summary (Signed)
Physician Discharge Summary  Patient ID: Chad Roth MRN: [id number] DOB/AGE: June 30, 1956 64 y.o.  Admit date: 10/28/2019 Discharge date: 10/29/2019  Admission Diagnoses:s/p hernia repair  Discharge Diagnoses:  Active Problems:   S/P hernia repair   Discharged Condition: good  Hospital Course: Pt was admitted s/p hernia repair for OBS.  Chad Roth did well post op from a pain and GI standpoint.  Chad Roth was ambulating well on his own and had good pain control.  Chad Roth was deemed stable for DC and DCd home.  Consults: None  Significant Diagnostic Studies: none  Treatments: surgery: as above  Discharge Exam: Blood pressure 133/88, pulse 70, temperature 98.2 F (36.8 C), temperature source Oral, resp. rate 17, height 5\' 11"  (1.803 m), weight 81.6 kg, SpO2 97 %. Constitutional: No acute distress, conversant, appears states age. Eyes: Anicteric sclerae, moist conjunctiva, no lid lag Lungs: Clear to auscultation bilaterally, normal respiratory effort CV: regular rate and rhythm, no murmurs, no peripheral edema, pedal pulses 2+ GI: Soft, no masses or hepatosplenomegaly, non-tender to palpation Skin: No rashes, palpation reveals normal turgor Psychiatric: appropriate judgment and insight, oriented to person, place, and time    Disposition: Discharge disposition: 01-Home or Self Care       Discharge Instructions    Diet - low sodium heart healthy   Complete by: As directed    Increase activity slowly   Complete by: As directed      Allergies as of 10/29/2019   No Known Allergies     Medication List    TAKE these medications   allopurinol 300 MG tablet Commonly known as: ZYLOPRIM Take 1 tablet by mouth once daily   amphetamine-dextroamphetamine 20 MG tablet Commonly known as: ADDERALL Take 20 mg by mouth 2 (two) times daily as needed (attention/focus).   aspirin EC 81 MG tablet Take 81 mg by mouth once a week.   ibuprofen 200 MG tablet Commonly known as: ADVIL Take 200 mg by  mouth every 6 (six) hours as needed.   omeprazole 20 MG capsule Commonly known as: PRILOSEC Take 20 mg by mouth daily.   tamsulosin 0.4 MG Caps capsule Commonly known as: FLOMAX Take 0.4 mg by mouth in the morning and at bedtime.   traMADol 50 MG tablet Commonly known as: ULTRAM Take 1 tablet (50 mg total) by mouth every 6 (six) hours as needed (mild pain).   Viagra 100 MG tablet Generic drug: sildenafil Take 50-100 mg by mouth daily as needed (urinary issues.).   vitamin C 1000 MG tablet Take 1,000 mg by mouth daily.      Follow-up Information    Ralene Ok, MD. Schedule an appointment as soon as possible for a visit in 2 weeks.   Specialty: General Surgery Why: Post op visit Contact information: [address] [place] [place] [address] [phone]           Signed: Ralene Ok 10/29/2019, 6:22 AM

## 2019-10-29 NOTE — Plan of Care (Signed)
  Problem: Education: Goal: Knowledge of General Education information will improve Description: Including pain rating scale, medication(s)/side effects and non-pharmacologic comfort measures Outcome: Completed/Met

## 2019-10-29 NOTE — Plan of Care (Signed)
  Problem: Education: Goal: Knowledge of General Education information will improve Description Including pain rating scale, medication(s)/side effects and non-pharmacologic comfort measures Outcome: Progressing   

## 2019-12-13 ENCOUNTER — Other Ambulatory Visit (INDEPENDENT_AMBULATORY_CARE_PROVIDER_SITE_OTHER): Payer: Self-pay | Admitting: Orthopaedic Surgery

## 2019-12-14 NOTE — Telephone Encounter (Signed)
Please advise 

## 2020-06-20 ENCOUNTER — Ambulatory Visit: Payer: Self-pay | Admitting: General Surgery

## 2020-06-20 NOTE — H&P (Signed)
  History of Present Illness Ralene Ok MD; 06/20/2020 3:39 PM) The patient is a 64 year old male who presents with an inguinal hernia. Chief Complaint: Recurrent hernia  Patient is a 63 year old male, who underwent laparoscopic converted to open right inguinal hernia repair with mesh in February 2021. Patient states that after [place] 2 months he had some discomfort and pain to the right inguinal area. He did notice a bulge the right inguinal area. He states he continues to work and do some lifting with his work. He states he does find it very uncomfortable.     Allergies (Chanel Teressa Senter, CMA; 06/20/2020 3:28 PM) No Known Drug Allergies  [10/05/2019]: Allergies Reconciled   Medication History (Chanel Teressa Senter, CMA; 06/20/2020 3:28 PM) traMADol HCl (50MG  Tablet, Oral) Active. Aspir-Low (81MG  Tablet DR, Oral) Active. Tamsulosin HCl (0.4MG  Capsule, Oral) Active. Finasteride (5MG  Tablet, Oral) Active. diazePAM (10MG  Tablet, Oral) Active. Omeprazole (20MG  Tab DR Disint, Oral) Active. Allopurinol (300MG  Tablet, Oral) Active. Pepcid (40MG  Tablet, Oral) Active. Medications Reconciled    Review of Systems Ralene Ok, MD; 06/20/2020 3:41 PM) All other systems negative  Vitals (Chanel Nolan CMA; 06/20/2020 3:28 PM) 06/20/2020 3:28 PM Weight: 192.5 lb Height: 70in Body Surface Area: 2.05 m Body Mass Index: 27.62 kg/m  Temp.: 27F  Pulse: 93 (Regular)  BP: 128/74(Sitting, Left Arm, Standard)       Physical Exam Ralene Ok MD; 06/20/2020 3:40 PM) The physical exam findings are as follows: Note: Constitutional: No acute distress, conversant, appears stated age  Eyes: Anicteric sclerae, moist conjunctiva, no lid lag  Neck: No thyromegaly, trachea midline, no cervical lymphadenopathy  Lungs: Clear to auscultation biilaterally, normal respiratory effot  Cardiovascular: regular rate & rhythm, no murmurs, no peripheal edema, pedal pulses  2+  GI: Soft, no masses or hepatosplenomegaly, non-tender to palpation  MSK: Normal gait, no clubbing cyanosis, edema  Skin: No rashes, palpation reveals normal skin turgor  Psychiatric: Appropriate judgment and insight, oriented to person, place, and time  Abdomen Inspection Hernias - Right - Inguinal hernia - Reducible - Right.    Assessment & Plan Ralene Ok MD; 06/20/2020 3:40 PM) RECURRENT RIGHT INGUINAL HERNIA (K40.91) Impression: Patient is a 64 year old male with a recurrent right inguinal hernia Secondary to patient discomfort the patient will like to have this repaired.  1. The patient will like to proceed to the operating room for robotic versus open right inguinal hernia repair with mesh.  2. I discussed with the patient the signs and symptoms of incarceration and strangulation and the need to proceed to the ER should they occur.  3. I discussed with the patient the risks and benefits of the procedure to include but not limited to: Infection, bleeding, damage to surrounding structures, possible need for further surgery, possible nerve pain, and possible recurrence. The patient was understanding and wishes to proceed.

## 2020-08-05 ENCOUNTER — Inpatient Hospital Stay (HOSPITAL_COMMUNITY): Admission: RE | Admit: 2020-08-05 | Payer: Medicare HMO | Source: Ambulatory Visit

## 2020-08-06 ENCOUNTER — Other Ambulatory Visit (HOSPITAL_COMMUNITY)
Admission: RE | Admit: 2020-08-06 | Discharge: 2020-08-06 | Disposition: A | Discharge status: Home / Self Care | Payer: Medicare HMO | Source: Ambulatory Visit | Attending: General Surgery | Admitting: General Surgery

## 2020-08-06 DIAGNOSIS — Z01812 Encounter for preprocedural laboratory examination: Secondary | ICD-10-CM | POA: Insufficient documentation

## 2020-08-06 DIAGNOSIS — Z20822 Contact with and (suspected) exposure to covid-19: Secondary | ICD-10-CM | POA: Insufficient documentation

## 2020-08-07 LAB — SARS CORONAVIRUS 2 (TAT 6-24 HRS): SARS Coronavirus 2: NEGATIVE

## 2020-08-08 NOTE — Progress Notes (Signed)
Attempted to call pt for pre-op call. No answer. Left detailed pre-op instructions on his voicemail. Reminded him that he needed to be in quarantine.

## 2020-08-09 ENCOUNTER — Ambulatory Visit (HOSPITAL_COMMUNITY)
Admission: RE | Admit: 2020-08-09 | Discharge: 2020-08-09 | Disposition: A | Discharge status: Home / Self Care | Payer: Medicare HMO | Attending: General Surgery | Admitting: General Surgery

## 2020-08-09 ENCOUNTER — Encounter (HOSPITAL_COMMUNITY): Payer: Self-pay | Admitting: General Surgery

## 2020-08-09 ENCOUNTER — Encounter (HOSPITAL_COMMUNITY)
Admission: RE | Disposition: A | Discharge status: Home / Self Care | Payer: Self-pay | Source: Home / Self Care | Attending: General Surgery

## 2020-08-09 ENCOUNTER — Other Ambulatory Visit: Payer: Self-pay

## 2020-08-09 ENCOUNTER — Ambulatory Visit (HOSPITAL_COMMUNITY): Payer: Medicare HMO | Admitting: Anesthesiology

## 2020-08-09 DIAGNOSIS — Z79899 Other long term (current) drug therapy: Secondary | ICD-10-CM | POA: Insufficient documentation

## 2020-08-09 DIAGNOSIS — K4091 Unilateral inguinal hernia, without obstruction or gangrene, recurrent: Secondary | ICD-10-CM | POA: Diagnosis present

## 2020-08-09 HISTORY — DX: Anxiety disorder, unspecified: F41.9

## 2020-08-09 HISTORY — PX: XI ROBOTIC ASSISTED INGUINAL HERNIA REPAIR WITH MESH: SHX6706

## 2020-08-09 HISTORY — PX: INSERTION OF MESH: SHX5868

## 2020-08-09 LAB — CBC
HCT: 52.2 % — ABNORMAL HIGH (ref 39.0–52.0)
Hemoglobin: 17 g/dL (ref 13.0–17.0)
MCH: 30.2 pg (ref 26.0–34.0)
MCHC: 32.6 g/dL (ref 30.0–36.0)
MCV: 92.9 fL (ref 80.0–100.0)
Platelets: 202 10*3/uL (ref 150–400)
RBC: 5.62 MIL/uL (ref 4.22–5.81)
RDW: 13 % (ref 11.5–15.5)
WBC: 5.4 10*3/uL (ref 4.0–10.5)
nRBC: 0 % (ref 0.0–0.2)

## 2020-08-09 SURGERY — REPAIR, HERNIA, INGUINAL, ROBOT-ASSISTED, LAPAROSCOPIC, USING MESH
Anesthesia: General | Site: Inguinal | Laterality: Right

## 2020-08-09 MED ORDER — CEFAZOLIN SODIUM-DEXTROSE 2-4 GM/100ML-% IV SOLN
2.0000 g | INTRAVENOUS | Status: AC
  Administered 2020-08-09: 2 g via INTRAVENOUS
  Filled 2020-08-09: qty 100

## 2020-08-09 MED ORDER — SUGAMMADEX SODIUM 200 MG/2ML IV SOLN
INTRAVENOUS | Status: DC | PRN
  Administered 2020-08-09: 170 mg via INTRAVENOUS

## 2020-08-09 MED ORDER — ROCURONIUM BROMIDE 10 MG/ML (PF) SYRINGE
PREFILLED_SYRINGE | INTRAVENOUS | Status: AC
  Filled 2020-08-09: qty 10

## 2020-08-09 MED ORDER — ONDANSETRON HCL 4 MG/2ML IJ SOLN
INTRAMUSCULAR | Status: AC
  Filled 2020-08-09: qty 2

## 2020-08-09 MED ORDER — PROPOFOL 10 MG/ML IV BOLUS
INTRAVENOUS | Status: AC
  Filled 2020-08-09: qty 20

## 2020-08-09 MED ORDER — FENTANYL CITRATE (PF) 250 MCG/5ML IJ SOLN
INTRAMUSCULAR | Status: DC | PRN
  Administered 2020-08-09: 100 ug via INTRAVENOUS
  Administered 2020-08-09 (×2): 50 ug via INTRAVENOUS

## 2020-08-09 MED ORDER — FENTANYL CITRATE (PF) 100 MCG/2ML IJ SOLN
INTRAMUSCULAR | Status: AC
  Filled 2020-08-09: qty 2

## 2020-08-09 MED ORDER — ROCURONIUM BROMIDE 10 MG/ML (PF) SYRINGE
PREFILLED_SYRINGE | INTRAVENOUS | Status: DC | PRN
  Administered 2020-08-09: 50 mg via INTRAVENOUS
  Administered 2020-08-09: 20 mg via INTRAVENOUS

## 2020-08-09 MED ORDER — MIDAZOLAM HCL 5 MG/5ML IJ SOLN
INTRAMUSCULAR | Status: DC | PRN
  Administered 2020-08-09: 2 mg via INTRAVENOUS

## 2020-08-09 MED ORDER — SODIUM CHLORIDE 0.9 % IV SOLN
INTRAVENOUS | Status: DC | PRN
  Administered 2020-08-09: 40 mL

## 2020-08-09 MED ORDER — LIDOCAINE HCL (PF) 2 % IJ SOLN
INTRAMUSCULAR | Status: AC
  Filled 2020-08-09: qty 5

## 2020-08-09 MED ORDER — DEXAMETHASONE SODIUM PHOSPHATE 10 MG/ML IJ SOLN
INTRAMUSCULAR | Status: AC
  Filled 2020-08-09: qty 1

## 2020-08-09 MED ORDER — TRAMADOL HCL 50 MG PO TABS
50.0000 mg | ORAL_TABLET | Freq: Four times a day (QID) | ORAL | 0 refills | Status: AC | PRN
Start: 2020-08-09 — End: 2021-08-09

## 2020-08-09 MED ORDER — ONDANSETRON HCL 4 MG/2ML IJ SOLN
INTRAMUSCULAR | Status: DC | PRN
  Administered 2020-08-09: 4 mg via INTRAVENOUS

## 2020-08-09 MED ORDER — OXYCODONE HCL 5 MG/5ML PO SOLN: 5.0000 mg | Freq: Once | ORAL | Status: AC | PRN

## 2020-08-09 MED ORDER — PHENYLEPHRINE 40 MCG/ML (10ML) SYRINGE FOR IV PUSH (FOR BLOOD PRESSURE SUPPORT)
PREFILLED_SYRINGE | INTRAVENOUS | Status: DC | PRN
  Administered 2020-08-09: 80 ug via INTRAVENOUS

## 2020-08-09 MED ORDER — CHLORHEXIDINE GLUCONATE 0.12 % MT SOLN
15.0000 mL | Freq: Once | OROMUCOSAL | Status: AC
  Administered 2020-08-09: 15 mL via OROMUCOSAL
  Filled 2020-08-09: qty 15

## 2020-08-09 MED ORDER — OXYCODONE HCL 5 MG PO TABS
5.0000 mg | ORAL_TABLET | Freq: Once | ORAL | Status: AC | PRN
  Administered 2020-08-09: 5 mg via ORAL

## 2020-08-09 MED ORDER — FENTANYL CITRATE (PF) 100 MCG/2ML IJ SOLN
25.0000 ug | INTRAMUSCULAR | Status: DC | PRN
  Administered 2020-08-09: 25 ug via INTRAVENOUS

## 2020-08-09 MED ORDER — CHLORHEXIDINE GLUCONATE CLOTH 2 % EX PADS
6.0000 | MEDICATED_PAD | Freq: Once | CUTANEOUS | Status: AC
  Administered 2020-08-09: 6 via TOPICAL

## 2020-08-09 MED ORDER — LIDOCAINE 2% (20 MG/ML) 5 ML SYRINGE
INTRAMUSCULAR | Status: DC | PRN
  Administered 2020-08-09: 40 mg via INTRAVENOUS

## 2020-08-09 MED ORDER — LACTATED RINGERS IV SOLN: INTRAVENOUS | Status: DC

## 2020-08-09 MED ORDER — PHENYLEPHRINE HCL-NACL 10-0.9 MG/250ML-% IV SOLN
INTRAVENOUS | Status: DC | PRN
  Administered 2020-08-09: 50 ug/min via INTRAVENOUS

## 2020-08-09 MED ORDER — MIDAZOLAM HCL 2 MG/2ML IJ SOLN
INTRAMUSCULAR | Status: AC
  Filled 2020-08-09: qty 2

## 2020-08-09 MED ORDER — 0.9 % SODIUM CHLORIDE (POUR BTL) OPTIME
TOPICAL | Status: DC | PRN
  Administered 2020-08-09: 1000 mL

## 2020-08-09 MED ORDER — ORAL CARE MOUTH RINSE: 15.0000 mL | Freq: Once | OROMUCOSAL | Status: AC

## 2020-08-09 MED ORDER — OXYCODONE HCL 5 MG PO TABS
ORAL_TABLET | ORAL | Status: AC
  Filled 2020-08-09: qty 1

## 2020-08-09 MED ORDER — ENSURE PRE-SURGERY PO LIQD: 296.0000 mL | Freq: Once | ORAL | Status: DC

## 2020-08-09 MED ORDER — DEXAMETHASONE SODIUM PHOSPHATE 10 MG/ML IJ SOLN
INTRAMUSCULAR | Status: DC | PRN
  Administered 2020-08-09: 5 mg via INTRAVENOUS

## 2020-08-09 MED ORDER — FENTANYL CITRATE (PF) 250 MCG/5ML IJ SOLN
INTRAMUSCULAR | Status: AC
  Filled 2020-08-09: qty 5

## 2020-08-09 MED ORDER — ONDANSETRON HCL 4 MG/2ML IJ SOLN: 4.0000 mg | Freq: Four times a day (QID) | INTRAMUSCULAR | Status: DC | PRN

## 2020-08-09 MED ORDER — PROPOFOL 10 MG/ML IV BOLUS
INTRAVENOUS | Status: DC | PRN
  Administered 2020-08-09: 150 mg via INTRAVENOUS

## 2020-08-09 MED ORDER — BUPIVACAINE LIPOSOME 1.3 % IJ SUSP
20.0000 mL | INTRAMUSCULAR | Status: DC
  Filled 2020-08-09: qty 20

## 2020-08-09 MED ORDER — BUPIVACAINE HCL 0.25 % IJ SOLN
INTRAMUSCULAR | Status: DC | PRN
  Administered 2020-08-09: 9 mL

## 2020-08-09 MED ORDER — CHLORHEXIDINE GLUCONATE CLOTH 2 % EX PADS: 6.0000 | MEDICATED_PAD | Freq: Once | CUTANEOUS | Status: DC

## 2020-08-09 MED ORDER — ACETAMINOPHEN 500 MG PO TABS
1000.0000 mg | ORAL_TABLET | ORAL | Status: AC
  Administered 2020-08-09: 1000 mg via ORAL
  Filled 2020-08-09: qty 2

## 2020-08-09 SURGICAL SUPPLY — 55 items
CHLORAPREP W/TINT 26 (MISCELLANEOUS) ×2 IMPLANT
COVER MAYO STAND STRL (DRAPES) ×2 IMPLANT
COVER SURGICAL LIGHT HANDLE (MISCELLANEOUS) ×2 IMPLANT
COVER TIP SHEARS 8 DVNC (MISCELLANEOUS) ×1 IMPLANT
COVER TIP SHEARS 8MM DA VINCI (MISCELLANEOUS) ×1
COVER WAND RF STERILE (DRAPES) IMPLANT
DECANTER SPIKE VIAL GLASS SM (MISCELLANEOUS) ×2 IMPLANT
DEFOGGER SCOPE WARMER CLEARIFY (MISCELLANEOUS) ×2 IMPLANT
DERMABOND ADVANCED (GAUZE/BANDAGES/DRESSINGS) ×1
DERMABOND ADVANCED .7 DNX12 (GAUZE/BANDAGES/DRESSINGS) ×1 IMPLANT
DEVICE TROCAR PUNCTURE CLOSURE (ENDOMECHANICALS) ×2 IMPLANT
DRAPE ARM DVNC X/XI (DISPOSABLE) ×4 IMPLANT
DRAPE COLUMN DVNC XI (DISPOSABLE) ×1 IMPLANT
DRAPE CV SPLIT W-CLR ANES SCRN (DRAPES) ×2 IMPLANT
DRAPE DA VINCI XI ARM (DISPOSABLE) ×4
DRAPE DA VINCI XI COLUMN (DISPOSABLE) ×1
DRAPE ORTHO SPLIT 77X108 STRL (DRAPES) ×1
DRAPE SURG ORHT 6 SPLT 77X108 (DRAPES) ×1 IMPLANT
ELECT REM PT RETURN 9FT ADLT (ELECTROSURGICAL) ×2
ELECTRODE REM PT RTRN 9FT ADLT (ELECTROSURGICAL) ×1 IMPLANT
GLOVE BIO SURGEON STRL SZ7.5 (GLOVE) ×4 IMPLANT
GOWN STRL REUS W/ TWL LRG LVL3 (GOWN DISPOSABLE) ×2 IMPLANT
GOWN STRL REUS W/ TWL XL LVL3 (GOWN DISPOSABLE) ×2 IMPLANT
GOWN STRL REUS W/TWL 2XL LVL3 (GOWN DISPOSABLE) ×2 IMPLANT
GOWN STRL REUS W/TWL LRG LVL3 (GOWN DISPOSABLE) ×2
GOWN STRL REUS W/TWL XL LVL3 (GOWN DISPOSABLE) ×2
KIT BASIN OR (CUSTOM PROCEDURE TRAY) ×2 IMPLANT
KIT TURNOVER KIT B (KITS) ×2 IMPLANT
MARKER SKIN DUAL TIP RULER LAB (MISCELLANEOUS) ×2 IMPLANT
MESH PROGRIP LAP SELF FIXATING (Mesh General) ×1 IMPLANT
MESH PROGRIP LAP SLF FIX 16X12 (Mesh General) ×1 IMPLANT
NEEDLE HYPO 22GX1.5 SAFETY (NEEDLE) ×2 IMPLANT
NEEDLE INSUFFLATION 14GA 120MM (NEEDLE) ×2 IMPLANT
OBTURATOR OPTICAL STANDARD 8MM (TROCAR)
OBTURATOR OPTICAL STND 8 DVNC (TROCAR)
OBTURATOR OPTICALSTD 8 DVNC (TROCAR) IMPLANT
PAD ARMBOARD 7.5X6 YLW CONV (MISCELLANEOUS) ×4 IMPLANT
SEAL CANN UNIV 5-8 DVNC XI (MISCELLANEOUS) ×3 IMPLANT
SEAL XI 5MM-8MM UNIVERSAL (MISCELLANEOUS) ×3
SET IRRIG TUBING LAPAROSCOPIC (IRRIGATION / IRRIGATOR) IMPLANT
SET TUBE SMOKE EVAC HIGH FLOW (TUBING) ×2 IMPLANT
STOPCOCK 4 WAY LG BORE MALE ST (IV SETS) ×2 IMPLANT
SUT MNCRL AB 4-0 PS2 18 (SUTURE) ×2 IMPLANT
SUT VIC AB 2-0 SH 27 (SUTURE)
SUT VIC AB 2-0 SH 27X BRD (SUTURE) IMPLANT
SUT VIC AB 3-0 SH 27 (SUTURE) ×1
SUT VIC AB 3-0 SH 27XBRD (SUTURE) ×1 IMPLANT
SUT VLOC 180 2-0 6IN GS21 (SUTURE) IMPLANT
SUT VLOC 180 2-0 9IN GS21 (SUTURE) ×2 IMPLANT
SYR 30ML SLIP (SYRINGE) ×2 IMPLANT
SYR TOOMEY 50ML (SYRINGE) ×2 IMPLANT
TOWEL GREEN STERILE FF (TOWEL DISPOSABLE) ×2 IMPLANT
TRAY FOLEY MTR SLVR 14FR STAT (SET/KITS/TRAYS/PACK) IMPLANT
TRAY FOLEY MTR SLVR 16FR STAT (SET/KITS/TRAYS/PACK) ×2 IMPLANT
TRAY LAPAROSCOPIC MC (CUSTOM PROCEDURE TRAY) ×2 IMPLANT

## 2020-08-09 NOTE — Transfer of Care (Signed)
Immediate Anesthesia Transfer of Care Note  Patient: Chad Roth  Procedure(s) Performed: XI ROBOTIC ASSISTED RIGHT  INGUINAL HERNIA REPAIR WITH MESH (Right Inguinal) INSERTION OF MESH (Right Inguinal)  Patient Location: PACU  Anesthesia Type:General  Level of Consciousness: awake, alert  and oriented  Airway & Oxygen Therapy: Patient connected to face mask oxygen  Post-op Assessment: Post -op Vital signs reviewed and stable  Post vital signs: stable  Last Vitals:  Vitals Value Taken Time  BP 143/89 08/09/20 1312  Temp    Pulse 67 08/09/20 1315  Resp 15 08/09/20 1315  SpO2 98 % 08/09/20 1315  Vitals shown include unvalidated device data.  Last Pain:  Vitals:   08/09/20 0921  TempSrc:   PainSc: 1       Patients Stated Pain Goal: 1 (37/94/44 6190)  Complications: No complications documented.

## 2020-08-09 NOTE — Anesthesia Preprocedure Evaluation (Signed)
Anesthesia Evaluation  Patient identified by MRN, date of birth, ID band Patient awake    Reviewed: Allergy & Precautions, H&P , NPO status , Patient's Chart, lab work & pertinent test results  Airway Mallampati: II   Neck ROM: full    Dental   Pulmonary neg pulmonary ROS,    breath sounds clear to auscultation       Cardiovascular negative cardio ROS   Rhythm:regular Rate:Normal     Neuro/Psych PSYCHIATRIC DISORDERS Anxiety    GI/Hepatic GERD  ,  Endo/Other    Renal/GU      Musculoskeletal  (+) Arthritis ,   Abdominal   Peds  Hematology   Anesthesia Other Findings   Reproductive/Obstetrics                             Anesthesia Physical Anesthesia Plan  ASA: II  Anesthesia Plan: General   Post-op Pain Management:    Induction: Intravenous  PONV Risk Score and Plan: 2 and Ondansetron, Dexamethasone, Midazolam and Treatment may vary due to age or medical condition  Airway Management Planned: Oral ETT  Additional Equipment:   Intra-op Plan:   Post-operative Plan: Extubation in OR  Informed Consent: I have reviewed the patients History and Physical, chart, labs and discussed the procedure including the risks, benefits and alternatives for the proposed anesthesia with the patient or authorized representative who has indicated his/her understanding and acceptance.       Plan Discussed with: CRNA, Anesthesiologist and Surgeon  Anesthesia Plan Comments:         Anesthesia Quick Evaluation

## 2020-08-09 NOTE — Anesthesia Procedure Notes (Signed)
Procedure Name: Intubation Date/Time: 08/09/2020 10:59 AM Performed by: Amadeo Garnet, CRNA Pre-anesthesia Checklist: Patient identified, Emergency Drugs available, Suction available and Patient being monitored Patient Re-evaluated:Patient Re-evaluated prior to induction Oxygen Delivery Method: Circle system utilized Preoxygenation: Pre-oxygenation with 100% oxygen Induction Type: IV induction Ventilation: Mask ventilation without difficulty Laryngoscope Size: Mac and 4 Grade View: Grade I Tube type: Oral Tube size: 7.0 mm Number of attempts: 1 Airway Equipment and Method: Stylet and Oral airway Placement Confirmation: ETT inserted through vocal cords under direct vision,  positive ETCO2 and breath sounds checked- equal and bilateral Secured at: 22 cm Tube secured with: Tape Dental Injury: Teeth and Oropharynx as per pre-operative assessment

## 2020-08-09 NOTE — Op Note (Signed)
08/09/2020  12:50 PM  PATIENT:  Chad Roth  64 y.o. male  PRE-OPERATIVE DIAGNOSIS:  RECURRENT RIGHT INGUINAL HERNIA  POST-OPERATIVE DIAGNOSIS:  LARGE, SLIDING, RECURRENT RIGHT INDIRECT INGUINAL HERNIA  PROCEDURE:  Procedure(s): XI ROBOTIC ASSISTED RIGHT  INGUINAL HERNIA REPAIR WITH MESH (Right) INSERTION OF MESH (Right)  SURGEON:  Surgeon(s) and Role:    Ralene Ok, MD - Primary\  ASSISTANTS: Pryor Curia, RNFA   ANESTHESIA:   local and general  EBL:  none   BLOOD ADMINISTERED:none  DRAINS: none   LOCAL MEDICATIONS USED:  BUPIVICAINE   SPECIMEN:  No Specimen  DISPOSITION OF SPECIMEN:  N/A  COUNTS:  YES  TOURNIQUET:  * No tourniquets in log *  DICTATION: .Dragon Dictation  Details of the procedure:   The patient was taken back to the operating room. The patient was placed in supine position with bilateral SCDs in place.  A Foley catheter was placed.  The patient was prepped and draped in the usual sterile fashion.  After appropriate anitbiotics were confirmed, a time-out was confirmed and all facts were verified.  At this time a Veress needle technique was used to inspect the abdomen approximately 10 cm from the valgus and the paramedian line. This time a 8 mm robotic trocar was placed into the abdomen. The camera was placed there was no injury to any intra-abdominal organs. A 72mm umbilical port was placed just superior to the umbilicus. An 8 mm port was placed approximately 10 cm lateral to the umbilicus on the left paramedian side.  Robot was positioned over the patient and the ports were docked in the usual fashion.  At this time the right-sided peritoneum was taken down from the medial umbilical ligament laterally. The pre-peritoneal space was entered. Dissection was taken down to Cooper's ligament. At this time it was apparent there was a large indirect hernia.  At this time proceeded clean out the rest Cooper's ligament and the medial to lateral  direction. This was difficult as there was a large amount of scar tissue.  I proceeded laterally to dissect the spermatic cord.  The large hernia was in the indirect space.  This was dissected and appeared to contain a sliding portion of the bladder.  The hernia and bladder were dissected free from the surroudnign tissues.  There was definitely some scar at the internal obliquie layer.  This made the dissection difficult. The spermatic cord was circumferentially dissected away from the surrounding musculature and tissue. The vas deferens was identified and protected all portions of the case. The peritoneum was dissected back. At this time I proceeded to create a pocket laterally for the mesh. Once the pocket was created the peritoneum was stripped back to approximately the base of the cord.  I sutured the peritoneal defect that was created with a running 3-0 vicryl. At this time the piece of 16x12cm progrip mesh was in placed into the dissected area. This covered both the direct and indirect spaces appropriately. This also covered  The femoral space. At this time the mesh lay flat from medial to lateral. At this time a 2-0 V- lock stitch was used to close the peritoneum in a standard running fashion.  At this time the robot was undocked. The umbilical port site was reapproximated using a 0 Vicryl via an Endo Close device 1. All ports were removed. The skin was reapproximated and all port sites using 4-0 Monocryl subcuticular fashion.  The patient the procedure well was taken to the recovery  PLAN OF CARE: Discharge to home after PACU  PATIENT DISPOSITION:  PACU - hemodynamically stable.   Delay start of Pharmacological VTE agent (>24hrs) due to surgical blood loss or risk of bleeding: not applicable

## 2020-08-09 NOTE — Discharge Instructions (Signed)
[facility] _______[facility] °INGUINAL HERNIA REPAIR: POST OP INSTRUCTIONS ° °Always review your discharge instruction sheet given to you by the facility where your surgery was performed. °IF YOU HAVE DISABILITY OR FAMILY LEAVE FORMS, YOU MUST BRING THEM TO THE OFFICE FOR PROCESSING.   °DO NOT GIVE THEM TO YOUR DOCTOR. ° °1. A  prescription for pain medication may be given to you upon discharge.  Take your pain medication as prescribed, if needed.  If narcotic pain medicine is not needed, then you may take acetaminophen (Tylenol) or ibuprofen (Advil) as needed. °2. Take your usually prescribed medications unless otherwise directed. °If you need a refill on your pain medication, please contact your pharmacy.  They will contact our office to request authorization. Prescriptions will not be filled after 5 pm or on week-ends. °3. You should follow a light diet the first 24 hours after arrival home, such as soup and crackers, etc.  Be sure to include lots of fluids daily.  Resume your normal diet the day after surgery. °4.Most patients will experience some swelling and bruising around the umbilicus or in the groin and scrotum.  Ice packs and reclining will help.  Swelling and bruising can take several days to resolve.  °6. It is common to experience some constipation if taking pain medication after surgery.  Increasing fluid intake and taking a stool softener (such as Colace) will usually help or prevent this problem from occurring.  A mild laxative (Milk of Magnesia or Miralax) should be taken according to package directions if there are no bowel movements after 48 hours. °7. Unless discharge instructions indicate otherwise, you may remove your bandages 24-48 hours after surgery, and you may shower at that time.  You may have steri-strips (small skin tapes) in place directly over the incision.  These strips should be left on the skin for 7-10 days.  If your surgeon used skin glue on the incision, you may  shower in 24 hours.  The glue will flake off over the next 2-3 weeks.  Any sutures or staples will be removed at the office during your follow-up visit. °8. ACTIVITIES:  You may resume regular (light) daily activities beginning the next day--such as daily self-care, walking, climbing stairs--gradually increasing activities as tolerated.  You may have sexual intercourse when it is comfortable.  Refrain from any heavy lifting or straining until approved by your doctor. ° °a.You may drive when you are no longer taking prescription pain medication, you can comfortably wear a seatbelt, and you can safely maneuver your car and apply brakes. °b.RETURN TO WORK:   °_____________________________________________ ° °9.You should see your doctor in the office for a follow-up appointment approximately 2-3 weeks after your surgery.  Make sure that you call for this appointment within a day or two after you arrive home to insure a convenient appointment time. °10.OTHER INSTRUCTIONS: _________________________ °   _____________________________________ ° °WHEN TO CALL YOUR DOCTOR: °1. Fever over 101.0 °2. Inability to urinate °3. Nausea and/or vomiting °4. Extreme swelling or bruising °5. Continued bleeding from incision. °6. Increased pain, redness, or drainage from the incision ° °The clinic staff is available to answer your questions during regular business hours.  Please don’t hesitate to call and ask to speak to one of the nurses for clinical concerns.  If you have a medical emergency, go to the nearest emergency room or call 911.  A surgeon from [facility] is always on call at the hospital ° ° °[address], [address], [place], [place]  [address] ? ° [address] [address], [place], [place]   [address] °[phone] ? [phone] ? FAX [phone] °Web site: [email or web address] ° °

## 2020-08-09 NOTE — H&P (Signed)
  History of Present Illness The patient is a 64 year old male who presents with an inguinal hernia. Chief Complaint: Recurrent hernia  Patient is a 64 year old male, who underwent laparoscopic converted to open right inguinal hernia repair with mesh in February 2021. Patient states that after [place] 2 months he had some discomfort and pain to the right inguinal area. He did notice a bulge the right inguinal area. He states he continues to work and do some lifting with his work. He states he does find it very uncomfortable.     Allergies  No Known Drug Allergies  [10/05/2019]: Allergies Reconciled   Medication History  traMADol HCl (50MG  Tablet, Oral) Active. Aspir-Low (81MG  Tablet DR, Oral) Active. Tamsulosin HCl (0.4MG  Capsule, Oral) Active. Finasteride (5MG  Tablet, Oral) Active. diazePAM (10MG  Tablet, Oral) Active. Omeprazole (20MG  Tab DR Disint, Oral) Active. Allopurinol (300MG  Tablet, Oral) Active. Pepcid (40MG  Tablet, Oral) Active. Medications Reconciled    Review of Systems  All other systems negative  BP (!) 163/93   Pulse 84   Temp 98 F (36.7 C) (Oral)   Resp 18   Ht 5\' 9"  (1.753 m)   Wt 82.6 kg   SpO2 96%   BMI 26.88 kg/m     Physical Exam The physical exam findings are as follows: Note: Constitutional: No acute distress, conversant, appears stated age  Eyes: Anicteric sclerae, moist conjunctiva, no lid lag  Neck: No thyromegaly, trachea midline, no cervical lymphadenopathy  Lungs: Clear to auscultation biilaterally, normal respiratory effot  Cardiovascular: regular rate & rhythm, no murmurs, no peripheal edema, pedal pulses 2+  GI: Soft, no masses or hepatosplenomegaly, non-tender to palpation  MSK: Normal gait, no clubbing cyanosis, edema  Skin: No rashes, palpation reveals normal skin turgor  Psychiatric: Appropriate judgment and insight, oriented to person, place, and time  Abdomen Inspection Hernias  - Right - Inguinal hernia - Reducible - Right.    Assessment & Plan  RECURRENT RIGHT INGUINAL HERNIA (K40.91) Impression: Patient is a 64 year old male with a recurrent right inguinal hernia Secondary to patient discomfort the patient will like to have this repaired.  1. The patient will like to proceed to the operating room for robotic versus open right inguinal hernia repair with mesh.  2. I discussed with the patient the signs and symptoms of incarceration and strangulation and the need to proceed to the ER should they occur.  3. I discussed with the patient the risks and benefits of the procedure to include but not limited to: Infection, bleeding, damage to surrounding structures, possible need for further surgery, possible nerve pain, and possible recurrence. The patient was understanding and wishes to proceed.

## 2020-08-09 NOTE — Anesthesia Postprocedure Evaluation (Signed)
Anesthesia Post Note  Patient: Chad Roth  Procedure(s) Performed: XI ROBOTIC ASSISTED RIGHT  INGUINAL HERNIA REPAIR WITH MESH (Right Inguinal) INSERTION OF MESH (Right Inguinal)     Patient location during evaluation: PACU Anesthesia Type: General Level of consciousness: awake and alert Pain management: pain level controlled Vital Signs Assessment: post-procedure vital signs reviewed and stable Respiratory status: spontaneous breathing, nonlabored ventilation, respiratory function stable and patient connected to nasal cannula oxygen Cardiovascular status: blood pressure returned to baseline and stable Postop Assessment: no apparent nausea or vomiting Anesthetic complications: no   No complications documented.  Last Vitals:  Vitals:   08/09/20 1400 08/09/20 1415  BP: 128/85 116/89  Pulse: 69 70  Resp: 13 16  Temp:  36.6 C  SpO2: 96% 96%    Last Pain:  Vitals:   08/09/20 1400  TempSrc:   PainSc: [place]

## 2020-08-10 ENCOUNTER — Encounter (HOSPITAL_COMMUNITY): Payer: Self-pay | Admitting: General Surgery

## 2021-02-16 ENCOUNTER — Encounter: Payer: Self-pay | Admitting: Orthopaedic Surgery

## 2021-02-16 ENCOUNTER — Ambulatory Visit (INDEPENDENT_AMBULATORY_CARE_PROVIDER_SITE_OTHER): Payer: Medicare HMO | Admitting: Orthopaedic Surgery

## 2021-02-16 ENCOUNTER — Other Ambulatory Visit: Payer: Self-pay

## 2021-02-16 DIAGNOSIS — Z96651 Presence of right artificial knee joint: Secondary | ICD-10-CM

## 2021-02-16 DIAGNOSIS — Z9889 Other specified postprocedural states: Secondary | ICD-10-CM

## 2021-02-16 DIAGNOSIS — Z8719 Personal history of other diseases of the digestive system: Secondary | ICD-10-CM | POA: Diagnosis not present

## 2021-02-16 NOTE — Progress Notes (Signed)
Office Visit Note   Patient: Chad Roth           Date of Birth: [birth year]           MRN: [id number] Visit Date: 02/16/2021              Requested by: Franki Monte, MD No address on file PCP: Franki Monte, MD   Assessment & Plan: Visit Diagnoses:  1. S/P hernia repair   2. Status post total right knee replacement     Plan: I am unable to review the CT scan which apparently showed increased adipose tissue in the canal lumbar spine.  He has no symptoms of spinal stenosis no radicular symptoms no nerve root tension signs.  Overall he thinks some of his symptoms with discomfort in his groin is gradually improving.  He can follow-up with me on an as-needed basis.  Follow-Up Instructions: Return if symptoms worsen or fail to improve.   Orders:  No orders of the defined types were placed in this encounter.  No orders of the defined types were placed in this encounter.     Procedures: No procedures performed   Clinical Data: No additional findings.   Subjective: Chief Complaint  Patient presents with   evaluate lumbar spine    HPI patient had bilateral total knee arthroplasties in the past which did well.  He states he had hernia surgery had to have 1 possibly recur after he did lifting too early.  He has had burning pain that radiates in the testicle that gets relief with doxycycline.  CT scan was done which showed some fat surrounding or occupying the spinal canal.  He denies back pain denies neurogenic claudication and states he can walk a long distance without problems.  Patient is taken some Neurontin but he is states he is not sure that actually helped with his symptoms.  He states after his hernia surgery he had a lot of bruising.  Difficult to determine if this was after the first or possibly after he did a lot of lifting early when he was exposed to etc.  Review of Systems 14 point system update unchanged other than as mentioned above.   Objective: Vital  Signs: There were no vitals taken for this visit.  Physical Exam Constitutional:      Appearance: He is well-developed.  HENT:     Head: Normocephalic and atraumatic.     Right Ear: External ear normal.     Left Ear: External ear normal.  Eyes:     Pupils: Pupils are equal, round, and reactive to light.  Neck:     Thyroid: No thyromegaly.     Trachea: No tracheal deviation.  Cardiovascular:     Rate and Rhythm: Normal rate.  Pulmonary:     Effort: Pulmonary effort is normal.     Breath sounds: No wheezing.  Abdominal:     General: Bowel sounds are normal.     Palpations: Abdomen is soft.  Musculoskeletal:     Cervical back: Neck supple.  Skin:    General: Skin is warm and dry.     Capillary Refill: Capillary refill takes less than 2 seconds.  Neurological:     Mental Status: He is alert and oriented to person, place, and time.  Psychiatric:        Behavior: Behavior normal.        Thought Content: Thought content normal.        Judgment: Judgment normal.  Ortho Exam negative logroll the hips negative right straight leg raising 90 degrees.  Knee and ankle jerk are intact no sciatic notch tenderness negative FABER test.  Specialty Comments:  No specialty comments available.  Imaging: No results found.   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  Past Surgical History:  Procedure Laterality Date   BILATERAL CARPAL TUNNEL RELEASE     COLONOSCOPY     HERNIA REPAIR     INGUINAL HERNIA REPAIR Left 10/28/2019   Procedure: LAPAROSCOPIC LEFT INGUINAL HERNIA REPAIR WITH MESH;  Surgeon:  Ralene Ok, MD;  Location: [place];  Service: General;  Laterality: Left;   INGUINAL HERNIA REPAIR Right 10/28/2019   Procedure: RIGHT Hernia Repair with mesh Inguinal Adult;  Surgeon: Ralene Ok, MD;  Location: [place];  Service: General;  Laterality: Right;   INSERTION OF MESH Right 08/09/2020   Procedure: INSERTION OF MESH;  Surgeon: Ralene Ok, MD;  Location: [place];  Service: General;  Laterality: Right;   JOINT REPLACEMENT     KNEE ARTHROPLASTY Right 01/04/2016   Procedure: COMPUTER ASSISTED TOTAL KNEE ARTHROPLASTY;  Surgeon: Marybelle Killings, MD;  Location: [place];  Service: Orthopedics;  Laterality: Right;   KNEE ARTHROPLASTY Left 04/02/2016   Procedure: COMPUTER ASSISTED TOTAL KNEE ARTHROPLASTY;  Surgeon: Marybelle Killings, MD;  Location: [place];  Service: Orthopedics;  Laterality: Left;   LAPAROSCOPIC INGUINAL HERNIA REPAIR  10/28/2019   LAPAROSCOPIC LEFT INGUINAL HERNIA REPAIR WITH MESH (Left Inguinal)    LEG SURGERY     RIGHT   MULTIPLE TOOTH EXTRACTIONS     TONSILLECTOMY     XI ROBOTIC ASSISTED INGUINAL HERNIA REPAIR WITH MESH Right 08/09/2020   Procedure: XI ROBOTIC ASSISTED RIGHT  INGUINAL HERNIA REPAIR WITH MESH;  Surgeon: Ralene Ok, MD;  Location: [place];  Service: General;  Laterality: Right;   Social History   Occupational History   Not on file  Tobacco Use   Smoking status: Never   Smokeless tobacco: Never  Vaping Use   Vaping Use: Never used  Substance and Sexual Activity   Alcohol use: Not Currently    Comment: BEER   OCC   Drug use: No   Sexual activity: Not on file

## 2021-03-16 ENCOUNTER — Ambulatory Visit: Payer: Medicare HMO | Admitting: Orthopaedic Surgery

## 2021-03-16 ENCOUNTER — Other Ambulatory Visit: Payer: Self-pay

## 2023-01-02 ENCOUNTER — Telehealth: Payer: Self-pay | Admitting: Orthopaedic Surgery

## 2023-01-02 MED ORDER — ALLOPURINOL 300 MG PO TABS: 300.0000 mg | ORAL_TABLET | Freq: Every day | ORAL | 2 refills | Status: AC

## 2023-01-02 NOTE — Addendum Note (Signed)
Addended by: Rogers Seeds on: 01/02/2023 04:00 PM   Modules accepted: Orders

## 2023-01-02 NOTE — Telephone Encounter (Signed)
Patient would like a refill on allopurinol stated the pain in his knees is coming back, advised patient he may be required to have an appt before we can refill medication he would rather go to the [place] office is he has to be seen first being he has not had an appt since 2022

## 2023-01-02 NOTE — Telephone Encounter (Signed)
Please advise?  Does patient need appointment?

## 2023-01-02 NOTE — Telephone Encounter (Signed)
Sent to pharmacy. I called patient and advised of message and instructions for taking medication.

## 2023-11-26 ENCOUNTER — Ambulatory Visit (INDEPENDENT_AMBULATORY_CARE_PROVIDER_SITE_OTHER): Admitting: Orthopaedic Surgery

## 2023-11-26 ENCOUNTER — Other Ambulatory Visit (INDEPENDENT_AMBULATORY_CARE_PROVIDER_SITE_OTHER): Payer: Self-pay

## 2023-11-26 ENCOUNTER — Encounter: Payer: Self-pay | Admitting: Orthopaedic Surgery

## 2023-11-26 ENCOUNTER — Ambulatory Visit: Admitting: Orthopaedic Surgery

## 2023-11-26 VITALS — BP 157/91 | HR 79 | Ht 71.0 in | Wt 185.0 lb

## 2023-11-26 DIAGNOSIS — M25512 Pain in left shoulder: Secondary | ICD-10-CM

## 2023-11-26 NOTE — Progress Notes (Unsigned)
 Office Visit Note   Patient: Chad Roth           Date of Birth: May 21, 1956           MRN: [id number] Visit Date: 11/26/2023              Requested by: Roland Rack, MD No address on file PCP: Roland Rack, MD   Assessment & Plan: Visit Diagnoses:  1. Acute pain of left shoulder     Plan: Will make patient appointment to see Dr. Dorene Grebe for evaluation.  We reviewed previous x-rays of his right shoulder which shows humeral head hitting the acromion and now new x-rays on the left shows identical problem.  Left was is better functioning arm as far as overhead lifting and reaching.  He has been able to compensate for the right shoulder.  He may be a candidate for surgery on the shoulder.  Follow-Up Instructions: No follow-ups on file.   Orders:  Orders Placed This Encounter  Procedures   XR Shoulder Left   No orders of the defined types were placed in this encounter.     Procedures: No procedures performed   Clinical Data: No additional findings.   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  Does not recall anything specific with onset.  He can flex his shoulder at least to get it to 90 degrees but cannot abduct.  Sensation his hand is normal denies associated neck pain.  Opposite right shoulder has been painful for some time he can get his right arm up over his head with some difficulty and has had right subacromial injection in the past and previous x-rays right shoulder shows humeral head hitting the acromion consistent with long standing rotator cuff tear.  Patient is right-hand dominant.  He principally had been using his left arm for overhead reaching.  He has had knee arthroplasty on the right doing well.  Review of Systems all systems noncontributory to HPI.   Objective: Vital  Signs: BP (!) 157/91   Pulse 79   Ht 5\' 11"  (1.803 m)   Wt 185 lb (83.9 kg)   BMI 25.80 kg/m   Physical Exam Constitutional:      Appearance: He is well-developed.  HENT:     Head: Normocephalic and atraumatic.     Right Ear: External ear normal.     Left Ear: External ear normal.  Eyes:     Pupils: Pupils are equal, round, and reactive to light.  Neck:     Thyroid: No thyromegaly.     Trachea: No tracheal deviation.  Cardiovascular:     Rate and Rhythm: Normal rate.  Pulmonary:     Effort: Pulmonary effort is normal.     Breath sounds: No wheezing.  Abdominal:     General: Bowel sounds are normal.     Palpations: Abdomen is soft.  Musculoskeletal:     Cervical back: Neck supple.  Skin:    General: Skin is warm and dry.     Capillary Refill: Capillary refill takes less than 2 seconds.  Neurological:     Mental Status: He is alert and oriented to person, place, and time.  Psychiatric:        Behavior: Behavior normal.        Thought Content: Thought content  normal.        Judgment: Judgment normal.     Ortho Exam positive drop arm test right.  Positive drop arm test left.  Some supraspinatus atrophy on the left.  Good cervical range of motion.  Station the hand is intact.  Specialty Comments:  No specialty comments available.  Imaging: No results found.   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  Past Surgical History:  Procedure Laterality Date   BILATERAL CARPAL TUNNEL RELEASE     COLONOSCOPY     HERNIA REPAIR     INGUINAL HERNIA REPAIR Left 10/28/2019   Procedure:  LAPAROSCOPIC LEFT INGUINAL HERNIA REPAIR WITH MESH;  Surgeon: Axel Filler, MD;  Location: [facility] OR;  Service: General;  Laterality: Left;   INGUINAL HERNIA REPAIR Right 10/28/2019   Procedure: RIGHT Hernia Repair with mesh Inguinal Adult;  Surgeon: Axel Filler, MD;  Location: [facility] OR;  Service: General;  Laterality: Right;   INSERTION OF MESH Right 08/09/2020   Procedure: INSERTION OF MESH;  Surgeon: Axel Filler, MD;  Location: [facility] OR;  Service: General;  Laterality: Right;   JOINT REPLACEMENT     KNEE ARTHROPLASTY Right 01/04/2016   Procedure: COMPUTER ASSISTED TOTAL KNEE ARTHROPLASTY;  Surgeon: Eldred Manges, MD;  Location: [facility] OR;  Service: Orthopedics;  Laterality: Right;   KNEE ARTHROPLASTY Left 04/02/2016   Procedure: COMPUTER ASSISTED TOTAL KNEE ARTHROPLASTY;  Surgeon: Eldred Manges, MD;  Location: [facility] OR;  Service: Orthopedics;  Laterality: Left;   LAPAROSCOPIC INGUINAL HERNIA REPAIR  10/28/2019   LAPAROSCOPIC LEFT INGUINAL HERNIA REPAIR WITH MESH (Left Inguinal)    LEG SURGERY     RIGHT   MULTIPLE TOOTH EXTRACTIONS     TONSILLECTOMY     XI ROBOTIC ASSISTED INGUINAL HERNIA REPAIR WITH MESH Right 08/09/2020   Procedure: XI ROBOTIC ASSISTED RIGHT  INGUINAL HERNIA REPAIR WITH MESH;  Surgeon: Axel Filler, MD;  Location: [facility] OR;  Service: General;  Laterality: Right;   Social History   Occupational History   Not on file  Tobacco Use   Smoking status: Never   Smokeless tobacco: Never  Vaping Use   Vaping status: Never Used  Substance and Sexual Activity   Alcohol use: Not Currently    Comment: BEER   OCC   Drug use: No   Sexual activity: Not on file

## 2023-12-04 ENCOUNTER — Encounter: Admitting: Orthopedic Surgery

## 2023-12-11 ENCOUNTER — Encounter: Payer: Self-pay | Admitting: Orthopedic Surgery

## 2023-12-11 ENCOUNTER — Ambulatory Visit: Admitting: Orthopedic Surgery

## 2023-12-11 DIAGNOSIS — M12812 Other specific arthropathies, not elsewhere classified, left shoulder: Secondary | ICD-10-CM

## 2023-12-11 DIAGNOSIS — M25512 Pain in left shoulder: Secondary | ICD-10-CM

## 2023-12-11 NOTE — Progress Notes (Unsigned)
 Office Visit Note   Patient: Chad Roth           Date of Birth: February 25, 1956           MRN: [id number] Visit Date: 12/11/2023 Requested by: Roland Rack, MD No address on file PCP: Roland Rack, MD  Subjective: Chief Complaint  Patient presents with   Left Shoulder - Pain    HPI: Chad Roth is a 68 y.o. male who presents to the office reporting left shoulder pain.  Patient works as an Barrister's clerk.  He enjoys doing that.  He has done well with knee replacements performed by Dr. Ophelia Charter.  He has friends in the area.  States his right shoulder is reasonably functional.  Left shoulder is painful and he has significantly diminished function on that side.  He does report night pain and rest pain with that left shoulder.  Denies much in the way of radicular symptoms..                ROS: All systems reviewed are negative as they relate to the chief complaint within the history of present illness.  Patient denies fevers or chills.  Assessment & Plan: Visit Diagnoses:  1. 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  He has diminished functional strength with forward flexion and abduction actively both less than 45 degrees.  Deltoid does fire.  I think they have is a good candidate for reverse shoulder replacement.  The risk and benefits of reverse shoulder replacement are discussed with the patient including not limited to infection nerve and vessel damage instability as well as incomplete pain relief and incomplete functional restoration.  Dislocation also risk.  The expected rehab time is discussed.  He currently is limiting himself to 20 pounds of lifting in terms of his work.  Does have good friend support network around.  Otherwise healthy.  Plan at this time is reverse shoulder replacement.  Thin cut CT scan requested for preop patient specific instrumentation planning to optimize implant position  and longevity.  All questions answered.  Follow-Up Instructions: No follow-ups on file.   Orders:  Orders Placed This Encounter  Procedures   CT SHOULDER LEFT WO CONTRAST   No orders of the defined types were placed in this encounter.     Procedures: No procedures performed   Clinical Data: No additional findings.  Objective: Vital Signs: There were no vitals taken for this visit.  Physical Exam:  Constitutional: Patient appears well-developed HEENT:  Head: Normocephalic Eyes:EOM are normal Neck: Normal range of motion Cardiovascular: Normal rate Pulmonary/chest: Effort normal Neurologic: Patient is alert Skin: Skin is warm Psychiatric: Patient has normal mood and affect  Ortho Exam: Ortho exam demonstrates passive range of motion on the left of 70/90/140 range of motion of the right is 70/100/150 passively.  Left shoulder active forward flexion and active abduction both below 45 degrees.  Deltoid fires.  Coarse grinding is present on the left with internal/external rotation of the arm.  Motor or sensory function intact to the left hand.  Cervical spine range of motion intact.  Right shoulder has active forward flexion and abduction both well above 90 degrees.  Specialty Comments:  No specialty comments available.  Imaging: No results found.   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 No pertinent family history.  Past Surgical History:  Procedure Laterality Date   BILATERAL CARPAL TUNNEL RELEASE     COLONOSCOPY     HERNIA REPAIR     INGUINAL HERNIA REPAIR Left 10/28/2019   Procedure:  LAPAROSCOPIC LEFT INGUINAL HERNIA REPAIR WITH MESH;  Surgeon: Axel Filler, MD;  Location: [facility] OR;  Service: General;  Laterality: Left;   INGUINAL HERNIA REPAIR Right 10/28/2019   Procedure: RIGHT Hernia Repair with mesh Inguinal Adult;  Surgeon: Axel Filler, MD;  Location: [facility] OR;  Service: General;  Laterality: Right;   INSERTION OF MESH Right 08/09/2020   Procedure: INSERTION OF MESH;  Surgeon: Axel Filler, MD;  Location: [facility] OR;  Service: General;  Laterality: Right;   JOINT REPLACEMENT     KNEE ARTHROPLASTY Right 01/04/2016   Procedure: COMPUTER ASSISTED TOTAL KNEE ARTHROPLASTY;  Surgeon: Eldred Manges, MD;  Location: [facility] OR;  Service: Orthopedics;  Laterality: Right;   KNEE ARTHROPLASTY Left 04/02/2016   Procedure: COMPUTER ASSISTED TOTAL KNEE ARTHROPLASTY;  Surgeon: Eldred Manges, MD;  Location: [facility] OR;  Service: Orthopedics;  Laterality: Left;   LAPAROSCOPIC INGUINAL HERNIA REPAIR  10/28/2019   LAPAROSCOPIC LEFT INGUINAL HERNIA REPAIR WITH MESH (Left Inguinal)    LEG SURGERY     RIGHT   MULTIPLE TOOTH EXTRACTIONS     TONSILLECTOMY     XI ROBOTIC ASSISTED INGUINAL HERNIA REPAIR WITH MESH Right 08/09/2020   Procedure: XI ROBOTIC ASSISTED RIGHT  INGUINAL HERNIA REPAIR WITH MESH;  Surgeon: Axel Filler, MD;  Location: [facility] OR;  Service: General;  Laterality: Right;   Social History   Occupational History   Not on file  Tobacco Use   Smoking status: Never   Smokeless tobacco: Never  Vaping Use   Vaping status: Never Used  Substance and Sexual Activity   Alcohol use: Not Currently    Comment: BEER   OCC   Drug use: No   Sexual activity: Not on file

## 2023-12-16 ENCOUNTER — Ambulatory Visit
Admission: RE | Admit: 2023-12-16 | Discharge: 2023-12-16 | Disposition: A | Discharge status: Home / Self Care | Source: Ambulatory Visit | Attending: Orthopedic Surgery | Admitting: Orthopedic Surgery

## 2023-12-16 DIAGNOSIS — M25512 Pain in left shoulder: Secondary | ICD-10-CM

## 2023-12-20 ENCOUNTER — Emergency Department (HOSPITAL_COMMUNITY)
Admission: EM | Admit: 2023-12-20 | Discharge: 2023-12-21 | Disposition: A | Discharge status: Home / Self Care | Attending: Emergency Medicine | Admitting: Emergency Medicine

## 2023-12-20 ENCOUNTER — Emergency Department (HOSPITAL_COMMUNITY)

## 2023-12-20 ENCOUNTER — Encounter (HOSPITAL_COMMUNITY): Payer: Self-pay

## 2023-12-20 ENCOUNTER — Other Ambulatory Visit: Payer: Self-pay

## 2023-12-20 DIAGNOSIS — S299XXA Unspecified injury of thorax, initial encounter: Secondary | ICD-10-CM | POA: Diagnosis present

## 2023-12-20 DIAGNOSIS — Z7982 Long term (current) use of aspirin: Secondary | ICD-10-CM | POA: Diagnosis not present

## 2023-12-20 DIAGNOSIS — N400 Enlarged prostate without lower urinary tract symptoms: Secondary | ICD-10-CM | POA: Insufficient documentation

## 2023-12-20 DIAGNOSIS — R911 Solitary pulmonary nodule: Secondary | ICD-10-CM | POA: Insufficient documentation

## 2023-12-20 DIAGNOSIS — Y990 Civilian activity done for income or pay: Secondary | ICD-10-CM | POA: Diagnosis not present

## 2023-12-20 DIAGNOSIS — S20212A Contusion of left front wall of thorax, initial encounter: Secondary | ICD-10-CM | POA: Diagnosis not present

## 2023-12-20 LAB — CBC WITH DIFFERENTIAL/PLATELET
Abs Immature Granulocytes: 0.01 10*3/uL (ref 0.00–0.07)
Basophils Absolute: 0 10*3/uL (ref 0.0–0.1)
Basophils Relative: 0 %
Eosinophils Absolute: 0.1 10*3/uL (ref 0.0–0.5)
Eosinophils Relative: 1 %
HCT: 50.8 % (ref 39.0–52.0)
Hemoglobin: 17 g/dL (ref 13.0–17.0)
Immature Granulocytes: 0 %
Lymphocytes Relative: 17 %
Lymphs Abs: 1.5 10*3/uL (ref 0.7–4.0)
MCH: 30.7 pg (ref 26.0–34.0)
MCHC: 33.5 g/dL (ref 30.0–36.0)
MCV: 91.7 fL (ref 80.0–100.0)
Monocytes Absolute: 0.5 10*3/uL (ref 0.1–1.0)
Monocytes Relative: 5 %
Neutro Abs: 6.8 10*3/uL (ref 1.7–7.7)
Neutrophils Relative %: 77 %
Platelets: 216 10*3/uL (ref 150–400)
RBC: 5.54 MIL/uL (ref 4.22–5.81)
RDW: 13.2 % (ref 11.5–15.5)
WBC: 8.9 10*3/uL (ref 4.0–10.5)
nRBC: 0 % (ref 0.0–0.2)

## 2023-12-20 LAB — COMPREHENSIVE METABOLIC PANEL WITH GFR
ALT: 19 U/L (ref 0–44)
AST: 37 U/L (ref 15–41)
Albumin: 3.7 g/dL (ref 3.5–5.0)
Alkaline Phosphatase: 69 U/L (ref 38–126)
Anion gap: 9 (ref 5–15)
BUN: 22 mg/dL (ref 8–23)
CO2: 24 mmol/L (ref 22–32)
Calcium: 8.9 mg/dL (ref 8.9–10.3)
Chloride: 103 mmol/L (ref 98–111)
Creatinine, Ser: 1.11 mg/dL (ref 0.61–1.24)
GFR, Estimated: >60 mL/min (ref >60)
Glucose, Bld: 194 mg/dL — ABNORMAL HIGH (ref 70–99)
Potassium: 3.6 mmol/L (ref 3.5–5.1)
Sodium: 136 mmol/L (ref 135–145)
Total Bilirubin: 1 mg/dL (ref 0.0–1.2)
Total Protein: 6.8 g/dL (ref 6.5–8.1)

## 2023-12-20 MED ORDER — IOHEXOL 300 MG/ML  SOLN
100.0000 mL | Freq: Once | INTRAMUSCULAR | Status: AC | PRN
  Administered 2023-12-20: 100 mL via INTRAVENOUS

## 2023-12-20 MED ORDER — FENTANYL CITRATE PF 50 MCG/ML IJ SOSY
50.0000 ug | PREFILLED_SYRINGE | Freq: Once | INTRAMUSCULAR | Status: AC
  Administered 2023-12-20: 50 ug via INTRAVENOUS
  Filled 2023-12-20: qty 1
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
## 2023-12-20 NOTE — ED Notes (Signed)
 When pt is discharged, can call his friend Polly Brink @ [phone] for a ride.

## 2023-12-21 MED ORDER — HYDROCODONE-ACETAMINOPHEN 5-325 MG PO TABS
1.0000 | ORAL_TABLET | Freq: Once | ORAL | Status: AC
  Administered 2023-12-21: 1 via ORAL
  Filled 2023-12-21: qty 1

## 2023-12-21 MED ORDER — HYDROCODONE-ACETAMINOPHEN 5-325 MG PO TABS: 1.0000 | ORAL_TABLET | Freq: Four times a day (QID) | ORAL | 0 refills | Status: DC | PRN

## 2023-12-21 NOTE — ED Notes (Signed)
 Bill, pt's friend, was called for a ride about 20 mins ago. Raenette Bumps is on his way to pick up the pt.

## 2023-12-21 NOTE — Discharge Instructions (Signed)
 Please follow up with Primary Care for further evaluation of the incidental CT findings we discussed.

## 2023-12-21 NOTE — ED Notes (Signed)
Went over d/c paperwork at this time with patient. Pt had no questions, comments or concerns after review and verbally understood them.

## 2024-01-01 NOTE — Progress Notes (Signed)
Blue sheet done thx

## 2024-02-02 DIAGNOSIS — I2510 Atherosclerotic heart disease of native coronary artery without angina pectoris: Secondary | ICD-10-CM

## 2024-02-02 HISTORY — DX: Atherosclerotic heart disease of native coronary artery without angina pectoris: I25.10

## 2024-02-03 ENCOUNTER — Telehealth: Payer: Self-pay

## 2024-02-03 ENCOUNTER — Encounter (HOSPITAL_COMMUNITY): Payer: Self-pay | Admitting: Orthopedic Surgery

## 2024-02-03 ENCOUNTER — Encounter (HOSPITAL_COMMUNITY): Payer: Self-pay | Admitting: Vascular Surgery

## 2024-02-03 ENCOUNTER — Telehealth: Payer: Self-pay | Admitting: Orthopedic Surgery

## 2024-02-03 NOTE — Telephone Encounter (Signed)
 thanks
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
## 2024-02-03 NOTE — Telephone Encounter (Signed)
 Patient would like a call back concerning his surgery from Dr. Rozelle Corning .CB# [phone].  Please advise.  Thank you

## 2024-02-03 NOTE — Telephone Encounter (Signed)
 Patient's left reverse shoulder arthroplasty scheduled 02-04-24 will need to be postponed, pending cardiac clearance. Please see 02-03-24 progress notes from [facility] with short stay anesthesia and provide cardiology referral.   Patient states he will go wherever they can see him the soonest. Patient's friend and emergency contact Raenette [address] is working on getting patient in with PCP-Dr John Z Hall in [place].

## 2024-02-04 ENCOUNTER — Encounter (HOSPITAL_COMMUNITY): Payer: Self-pay | Admitting: Vascular Surgery

## 2024-02-04 ENCOUNTER — Encounter (HOSPITAL_COMMUNITY): Admission: RE | Payer: Self-pay | Source: Home / Self Care

## 2024-02-04 ENCOUNTER — Ambulatory Visit (HOSPITAL_COMMUNITY): Admission: RE | Admit: 2024-02-04 | Source: Home / Self Care | Admitting: Orthopedic Surgery

## 2024-02-04 ENCOUNTER — Other Ambulatory Visit: Payer: Self-pay

## 2024-02-04 DIAGNOSIS — M25512 Pain in left shoulder: Secondary | ICD-10-CM

## 2024-02-04 DIAGNOSIS — Z01818 Encounter for other preprocedural examination: Secondary | ICD-10-CM

## 2024-02-04 HISTORY — DX: Disorders of diaphragm: J98.6

## 2024-02-04 SURGERY — ARTHROPLASTY, SHOULDER, TOTAL, REVERSE
Anesthesia: General | Site: Shoulder | Laterality: Left
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
## 2024-02-12 ENCOUNTER — Encounter: Payer: Self-pay | Admitting: Cardiology

## 2024-02-12 ENCOUNTER — Ambulatory Visit: Attending: Cardiology | Admitting: Cardiology

## 2024-02-12 VITALS — BP 132/82 | HR 78 | Ht 71.0 in | Wt 170.0 lb

## 2024-02-12 DIAGNOSIS — Z01810 Encounter for preprocedural cardiovascular examination: Secondary | ICD-10-CM | POA: Diagnosis not present

## 2024-02-12 DIAGNOSIS — R011 Cardiac murmur, unspecified: Secondary | ICD-10-CM | POA: Diagnosis not present

## 2024-02-12 DIAGNOSIS — R0609 Other forms of dyspnea: Secondary | ICD-10-CM | POA: Diagnosis not present

## 2024-02-12 DIAGNOSIS — I2510 Atherosclerotic heart disease of native coronary artery without angina pectoris: Secondary | ICD-10-CM | POA: Diagnosis not present

## 2024-02-12 MED ORDER — METOPROLOL TARTRATE 50 MG PO TABS: ORAL_TABLET | ORAL | 0 refills | Status: DC

## 2024-02-12 NOTE — Patient Instructions (Addendum)
 Medication Instructions:  Your physician recommends that you continue on your current medications as directed. Please refer to the Current Medication list given to you today.  *If you need a refill on your cardiac medications before your next appointment, please call your pharmacy*  Lab Work: [facility] - 30 days before scan  You may go to any [facility] Location for your lab work:  [facility] - [address] ([facility]) - [phone] [address]  [place] - [address]   [place]  - [address]   [place] - [address] - [address] [employer]  - [address] ([facility]   If you have labs (blood work) drawn today and your tests are completely normal, you will receive your results only by: [employer] (if you have MyChart)  If you have any lab test that is abnormal or we need to change your treatment, we will call you or send a MyChart message to review the results.  Testing/Procedures:   Your cardiac CT will be scheduled at :  [facility] [address] [place], [place] [address] [phone]   Please arrive 30 minutes early for check-in and test prep.  Please follow these instructions carefully (unless otherwise directed):  An IV will be required for this test and Nitroglycerin will be given.  Hold all erectile dysfunction medications at least 3 days (72 hrs) prior to test. (Ie viagra, cialis, sildenafil, tadalafil, etc)   On the Night Before the Test: Be sure to Drink plenty of water. Do not consume any caffeinated/decaffeinated beverages or chocolate 12 hours prior to your test. Do not take any antihistamines 12 hours prior to your test.  On the Day of the Test: Drink plenty of water until 1 hour prior to the test. Do not eat any food 1 hour prior to test. You may take your regular medications  prior to the test.  Take metoprolol (Lopressor) two hours prior to test. If you take Furosemide/Hydrochlorothiazide/Spironolactone/Chlorthalidone, please HOLD on the morning of the test. Patients who wear a continuous glucose monitor MUST remove the device prior to scanning.      After the Test: Drink plenty of water. After receiving IV contrast, you may experience a mild flushed feeling. This is normal. On occasion, you may experience a mild rash up to 24 hours after the test. This is not dangerous. If this occurs, you can take Benadryl 25 mg, Zyrtec, Claritin, or Allegra and increase your fluid intake. (Patients taking Tikosyn should avoid Benadryl, and may take Zyrtec, Claritin, or Allegra) If you experience trouble breathing, this can be serious. If it is severe call 911 IMMEDIATELY. If it is mild, please call our office.  We will call to schedule your test 2-4 weeks out understanding that some insurance companies will need an authorization prior to the service being performed.   For more information and frequently asked questions, please visit our website : [email or web address]  For non-scheduling related questions, please contact the cardiac imaging nurse navigator should you have any questions/concerns: Cardiac Imaging Nurse Navigators Direct Office Dial: [phone]   For scheduling needs, including cancellations and rescheduling, please call [place], [phone].   Follow-Up: At [facility], you and your health needs are our priority.  As part of our continuing mission to provide you with exceptional heart care, we  have created designated Provider Care Teams.  These Care Teams include your primary Cardiologist (physician) and Advanced Practice Providers (APPs -  Physician Assistants and Nurse Practitioners) who all work together to provide you with the care you need, when you need it.  Your next appointment:   1 year(s)  The format for your next appointment:    In Person  Provider:   Hillis Lu, MD

## 2024-02-12 NOTE — Progress Notes (Signed)
 Cardiology Office Note:    Date:  02/12/2024   ID:  Chad Roth, DOB [birth year], MRN [id number]  PCP:  Pcp, No  Cardiologist:  Nelia Balzarine, MD   Referring MD: Jasmine Mesi, MD    ASSESSMENT:    1. Preop cardiovascular exam   2. Coronary artery calcification    PLAN:    In order of problems listed above:  Coronary artery calcification: I discussed this with the patient at length.  I got this diagnosis from CT scan of his chest.  He denies any chest pain orthopnea or PND.  However he has bilateral knee replacement and ambulates but does not exercise.  In view of this I have recommended the following Preoperative cardiovascular evaluation: In view of the above I recommended CT coronary angiography with FFR.  He is agreeable.  If this is negative then he can proceed with the surgery as not high risk. Cardiac murmur: Echocardiogram will be done to assess murmur heard on auscultation. Statin therapy: I discussed with the patient that in view of coronary artery calcification he will need to be on statin therapy.  This will have to be followed by his primary care.  Goal LDL must be less than 60.  This is guideline directed medical therapy. Patient will be seen in follow-up appointment in 12 months or earlier if the patient has any concerns.    Medication Adjustments/Labs and Tests Ordered: Current medicines are reviewed at length with the patient today.  Concerns regarding medicines are outlined above.  Orders Placed This Encounter  Procedures   EKG 12-Lead   No orders of the defined types were placed in this encounter.    History of Present Illness:    Chad Roth is a 68 y.o. male who is being seen today for the evaluation of preoperative cardiovascular evaluation at the request of Rozelle Corning, Maricela Shoe, MD. patient is a pleasant 68 year old male.  He has past medical history of coronary artery calcifications.  He leads a sedentary lifestyle overall.  He is fully employed  full-time.  He mentions to me that he is here for evaluation for preop assessment for shoulder surgery reversal.  He says he will need both shoulders to be done eventually.  Again he denies any chest pain orthopnea or PND.  He has some dyspnea on exertion.  At the time of my evaluation, the patient is alert awake oriented and in no distress.  Past Medical History:  Diagnosis Date   ADHD (attention deficit hyperactivity disorder)    Anxiety    Arthritis    Bilateral inguinal hernia    Elevated hemidiaphragm    on right per 12/20/23 CXR   GERD (gastroesophageal reflux disease)    Gout    Wears glasses     Past Surgical History:  Procedure Laterality Date   BILATERAL CARPAL TUNNEL RELEASE     COLONOSCOPY     HERNIA REPAIR     INGUINAL HERNIA REPAIR Left 10/28/2019   Procedure: LAPAROSCOPIC LEFT INGUINAL HERNIA REPAIR WITH MESH;  Surgeon: Shela Derby, MD;  Location: [facility] OR;  Service: General;  Laterality: Left;   INGUINAL HERNIA REPAIR Right 10/28/2019   Procedure: RIGHT Hernia Repair with mesh Inguinal Adult;  Surgeon: Shela Derby, MD;  Location[facility] OR;  Service: General;  Laterality: Right;   INSERTION OF MESH Right 08/09/2020   Procedure: INSERTION OF MESH;  Surgeon: Shela Derby, MD;  Location: [facility] OR;  Service: General;  Laterality: Right;   JOINT REPLACEMENT  KNEE ARTHROPLASTY Right 01/04/2016   Procedure: COMPUTER ASSISTED TOTAL KNEE ARTHROPLASTY;  Surgeon: Adah Acron, MD;  Location: [facility] OR;  Service: Orthopedics;  Laterality: Right;   KNEE ARTHROPLASTY Left 04/02/2016   Procedure: COMPUTER ASSISTED TOTAL KNEE ARTHROPLASTY;  Surgeon: Adah Acron, MD;  Location: [facility] OR;  Service: Orthopedics;  Laterality: Left;   LAPAROSCOPIC INGUINAL HERNIA REPAIR  10/28/2019   LAPAROSCOPIC LEFT INGUINAL HERNIA REPAIR WITH MESH (Left Inguinal)    LEG SURGERY     RIGHT   MULTIPLE TOOTH EXTRACTIONS     TONSILLECTOMY     XI ROBOTIC ASSISTED INGUINAL HERNIA REPAIR WITH MESH Right  08/09/2020   Procedure: XI ROBOTIC ASSISTED RIGHT  INGUINAL HERNIA REPAIR WITH MESH;  Surgeon: Shela Derby, MD;  Location: [facility] OR;  Service: General;  Laterality: Right;    Current Medications: Current Meds  Medication Sig   allopurinol  (ZYLOPRIM ) 300 MG tablet Take 1 tablet (300 mg total) by mouth daily. (Patient taking differently: Take 300 mg by mouth daily as needed (gout).)   aspirin  EC 81 MG tablet Take 81 mg by mouth every 30 (thirty) days.   Ibuprofen-Acetaminophen  (ADVIL DUAL ACTION) 125-250 MG TABS Take 2 tablets by mouth daily as needed (pain).   omeprazole (PRILOSEC OTC) 20 MG tablet Take 20 mg by mouth daily as needed (acid reflux).   predniSONE (DELTASONE) 10 MG tablet Take 3 tablets by mouth x 2 days then 2 tablets x 2 days then 1 tablet x 2 days. Take with food   Psyllium (METAMUCIL PO) Take 1 packet by mouth every other day.     Allergies:   Patient has no known allergies.   Social History   Socioeconomic History   Marital status: Single    Spouse name: Not on file   Number of children: Not on file   Years of education: Not on file   Highest education level: Not on file  Occupational History   Not on file  Tobacco Use   Smoking status: Never   Smokeless tobacco: Never  Vaping Use   Vaping status: Never Used  Substance and Sexual Activity   Alcohol use: Not Currently    Comment: BEER   OCC   Drug use: No   Sexual activity: Not on file  Other Topics Concern   Not on file  Social History Narrative   Not on file   Social Drivers of Health   Financial Resource Strain: Low Risk  (10/07/2021)   Received from [facility], [facility]   Overall Financial Resource Strain (CARDIA)    Difficulty of Paying Living Expenses: Not hard at all  Food Insecurity: Food Insecurity Present (10/07/2021)   Received from [facility], [facility]   Hunger Vital Sign    Worried About Running Out of Food in the Last Year: Never true    Ran Out of Food in the Last Year:  Sometimes true  Transportation Needs: No Transportation Needs (10/07/2021)   Received from [facility], [facility]   PRAPARE - Transportation    Lack of Transportation (Medical): No    Lack of Transportation (Non-Medical): No  Physical Activity: Sufficiently Active (10/07/2021)   Received from [facility], [facility]   Exercise Vital Sign    Days of Exercise per Week: 5 days    Minutes of Exercise per Session: 120 min  Stress: No Stress Concern Present (10/07/2021)   Received from [facility], [facility] of Occupational Health - Occupational Stress  Questionnaire    Feeling of Stress : Not at all  Social Connections: Unknown (01/04/2022)   Received from [facility], [facility]   Social Network    Social Network: Not on file     Family History: The patient's family history is not on file.  ROS:   Please see the history of present illness.    All other systems reviewed and are negative.  EKGs/Labs/Other Studies Reviewed:    The following studies were reviewed today:  EKG Interpretation Date/Time:  Wednesday February 12 2024 08:47:14 EDT Ventricular Rate:  78 PR Interval:  150 QRS Duration:  96 QT Interval:  408 QTC Calculation: 465 R Axis:   -17  Text Interpretation: Normal sinus rhythm Normal ECG When compared with ECG of 20-Dec-2023 22:35, PREVIOUS ECG IS PRESENT Confirmed by Hillis Lu [phone]) on 02/12/2024 9:00:51 AM     Recent Labs: 12/20/2023: ALT 19; BUN 22; Creatinine, Ser 1.11; Hemoglobin 17.0; Platelets 216; Potassium 3.6; Sodium 136  Recent Lipid Panel No results found for: CHOL, TRIG, HDL, CHOLHDL, VLDL, LDLCALC, LDLDIRECT  Physical Exam:    VS:  BP 132/82   Pulse 78   Ht 5' 11 (1.803 m)   Wt 170 lb (77.1 kg)   SpO2 97%   BMI 23.71 kg/m     Wt Readings from Last 3 Encounters:  02/12/24 170 lb (77.1 kg)  12/20/23 188 lb (85.3 kg)  11/26/23 185 lb (83.9 kg)     GEN: Patient is in no acute  distress HEENT: Normal NECK: No JVD; No carotid bruits LYMPHATICS: No lymphadenopathy CARDIAC: S1 S2 regular, 2/6 systolic murmur at the apex. RESPIRATORY:  Clear to auscultation without rales, wheezing or rhonchi  ABDOMEN: Soft, non-tender, non-distended MUSCULOSKELETAL:  No edema; No deformity  SKIN: Warm and dry NEUROLOGIC:  Alert and oriented x 3 PSYCHIATRIC:  Normal affect    Signed, Nelia Balzarine, MD  02/12/2024 9:21 AM    [facility]

## 2024-02-12 NOTE — Addendum Note (Signed)
 Addended by: Marylu Dudenhoeffer N on: 02/12/2024 10:14 AM   Modules accepted: Orders

## 2024-02-19 ENCOUNTER — Encounter: Admitting: Surgical

## 2024-02-19 ENCOUNTER — Ambulatory Visit: Payer: Self-pay | Admitting: Cardiology

## 2024-02-19 ENCOUNTER — Ambulatory Visit (HOSPITAL_COMMUNITY)
Admission: RE | Admit: 2024-02-19 | Discharge: 2024-02-19 | Disposition: A | Discharge status: Home / Self Care | Source: Ambulatory Visit | Attending: Cardiology | Admitting: Cardiology

## 2024-02-19 DIAGNOSIS — R011 Cardiac murmur, unspecified: Secondary | ICD-10-CM | POA: Diagnosis not present

## 2024-02-19 DIAGNOSIS — I2510 Atherosclerotic heart disease of native coronary artery without angina pectoris: Secondary | ICD-10-CM

## 2024-02-19 DIAGNOSIS — Z01810 Encounter for preprocedural cardiovascular examination: Secondary | ICD-10-CM | POA: Diagnosis present

## 2024-02-19 LAB — ECHOCARDIOGRAM COMPLETE
Area-P 1/2: 2.79 cm2
P 1/2 time: 643 ms
S' Lateral: 2.5 cm

## 2024-02-20 ENCOUNTER — Encounter: Admitting: Surgical

## 2024-02-26 NOTE — Telephone Encounter (Signed)
Viewed in MyChart Routed to PCP  

## 2024-03-02 ENCOUNTER — Telehealth: Payer: Self-pay | Admitting: Orthopedic Surgery

## 2024-03-02 NOTE — Telephone Encounter (Signed)
 Patient called. Says he is ready to schedule surgery. He has the ok from his other doctors.

## 2024-03-18 ENCOUNTER — Encounter (HOSPITAL_COMMUNITY): Payer: Self-pay

## 2024-03-18 NOTE — Progress Notes (Signed)
 Patient no-showed for his 10 AM PAT appointment.  Called patient, got VM, LMOM.

## 2024-03-18 NOTE — Progress Notes (Signed)
 Surgical Instructions   Your procedure is scheduled on Thursday, 03/26/24. Report to [facility] Main Entrance A at 10:55 A.M., then check in with the Admitting office. Any questions or running late day of surgery: call [phone]  Questions prior to your surgery date: call [phone], Monday-Friday, 8am-4pm. If you experience any cold or flu symptoms such as cough, fever, chills, shortness of breath, etc. between now and your scheduled surgery, please notify us  at the above number.     Remember:  Do not eat after midnight the night before your surgery-Wednesday  You may drink clear liquids until 10 AM, the morning of your surgery-Thursday.   Clear liquids allowed are: Water, Non-Citrus Juices (without pulp), Carbonated Beverages, Clear Tea (no milk, honey, etc.), Black Coffee Only (NO MILK, CREAM OR POWDERED CREAMER of any kind), and Gatorade.    Take these medicines the morning of surgery with A SIP OF WATER: None   May take these medicines IF NEEDED: allopurinol  (ZYLOPRIM )  omeprazole (PRILOSEC OTC)    One week prior to surgery, STOP taking any Aspirin  (unless otherwise instructed by your surgeon) Aleve, Naproxen, Ibuprofen, Motrin, Advil, Goody's, BC's, all herbal medications, fish oil, and non-prescription vitamins.                     Do NOT Smoke (Tobacco/Vaping) for 24 hours prior to your procedure.  If you use a CPAP at night, you may bring your mask/headgear for your overnight stay.   You will be asked to remove any contacts, glasses, piercing's, hearing aid's, dentures/partials prior to surgery. Please bring cases for these items if needed.    Patients discharged the day of surgery will not be allowed to drive home, and someone needs to stay with them for 24 hours.  SURGICAL WAITING ROOM VISITATION Patients may have no more than 2 support people in the waiting area - these visitors may rotate.   Pre-op nurse will coordinate an appropriate time for 1 ADULT support  person, who may not rotate, to accompany patient in pre-op.  Children under the age of 81 must have an adult with them who is not the patient and must remain in the main waiting area with an adult.  If the patient needs to stay at the hospital during part of their recovery, the visitor guidelines for inpatient rooms apply.  Please refer to the [facility] website for the visitor guidelines for any additional information.   If you received a COVID test during your pre-op visit  it is requested that you wear a mask when out in public, stay away from anyone that may not be feeling well and notify your surgeon if you develop symptoms. If you have been in contact with anyone that has tested positive in the last 10 days please notify you surgeon.      Pre-operative 5 CHG Bathing Instructions   You can play a key role in reducing the risk of infection after surgery. Your skin needs to be as free of germs as possible. You can reduce the number of germs on your skin by washing with CHG (chlorhexidine  gluconate) soap before surgery. CHG is an antiseptic soap that kills germs and continues to kill germs even after washing.   DO NOT use if you have an allergy to chlorhexidine /CHG or antibacterial soaps. If your skin becomes reddened or irritated, stop using the CHG and notify one of our RNs at [phone].   Please shower with the CHG soap starting 4 days  before surgery using the following schedule:     Please keep in mind the following:  DO NOT shave, including legs and underarms, starting the day of your first shower.   You may shave your face at any point before/day of surgery.  Place clean sheets on your bed the day you start using CHG soap. Use a clean washcloth (not used since being washed) for each shower. DO NOT sleep with pets once you start using the CHG.   CHG Shower Instructions:  Wash your face and private area with normal soap. If you choose to wash your hair, wash first with your  normal shampoo.  After you use shampoo/soap, rinse your hair and body thoroughly to remove shampoo/soap residue.  Turn the water OFF and apply about 3 tablespoons (45 ml) of CHG soap to a CLEAN washcloth.  Apply CHG soap ONLY FROM YOUR NECK DOWN TO YOUR TOES (washing for 3-5 minutes)  DO NOT use CHG soap on face, private areas, open wounds, or sores.  Pay special attention to the area where your surgery is being performed.  If you are having back surgery, having someone wash your back for you may be helpful. Wait 2 minutes after CHG soap is applied, then you may rinse off the CHG soap.  Pat dry with a clean towel  Put on clean clothes/pajamas   If you choose to wear lotion, please use ONLY the CHG-compatible lotions that are listed below.  Additional instructions for the day of surgery: DO NOT APPLY any lotions, deodorants, cologne, or perfumes.   Do not bring valuables to the hospital. [facility] is not responsible for any belongings/valuables. Do not wear nail polish, gel polish, artificial nails, or any other type of covering on natural nails (fingers and toes) Do not wear jewelry or makeup Put on clean/comfortable clothes.  Please brush your teeth.  Ask your nurse before applying any prescription medications to the skin.   Please follow these instructions carefully:   BENZOYL PEROXIDE 5% GEL   Please do not use if you have an allergy to benzoyl peroxide.   If your skin becomes reddened/irritated stop using the benzoyl peroxide.   Starting two days before surgery, apply as follows:   Apply benzoyl peroxide in the morning and at night. Apply after taking a shower. If you are not taking a shower clean entire shoulder front, back, and side along with the armpit with a clean wet washcloth.   Place a quarter-sized dollop on your shoulder and rub in thoroughly, making sure to cover the front, back, and side of your shoulder, along with the armpit.     2 days before ____ AM   ____ PM               1 day before ____ AM   ____ PM                       Do this twice a day for two days.  (Last application is the night before surgery, AFTER using the CHG soap as described below).   Do NOT apply benzoyl peroxide gel on the day of surgery.           CHG Compatible Lotions   Aveeno Moisturizing lotion  Cetaphil Moisturizing Cream  Cetaphil Moisturizing Lotion  Clairol Herbal Essence Moisturizing Lotion, Dry Skin  Clairol Herbal Essence Moisturizing Lotion, Extra Dry Skin  Clairol Herbal Essence Moisturizing Lotion, Normal Skin  Curel Age Defying Therapeutic  Moisturizing Lotion with Alpha Hydroxy  Curel Extreme Care Body Lotion  Curel Soothing Hands Moisturizing Hand Lotion  Curel Therapeutic Moisturizing Cream, Fragrance-Free  Curel Therapeutic Moisturizing Lotion, Fragrance-Free  Curel Therapeutic Moisturizing Lotion, Original Formula  Eucerin Daily Replenishing Lotion  Eucerin Dry Skin Therapy Plus Alpha Hydroxy Crme  Eucerin Dry Skin Therapy Plus Alpha Hydroxy Lotion  Eucerin Original Crme  Eucerin Original Lotion  Eucerin Plus Crme Eucerin Plus Lotion  Eucerin TriLipid Replenishing Lotion  Keri Anti-Bacterial Hand Lotion  Keri Deep Conditioning Original Lotion Dry Skin Formula Softly Scented  Keri Deep Conditioning Original Lotion, Fragrance Free Sensitive Skin Formula  Keri Lotion Fast Absorbing Fragrance Free Sensitive Skin Formula  Keri Lotion Fast Absorbing Softly Scented Dry Skin Formula  Keri Original Lotion  Keri Skin Renewal Lotion Keri Silky Smooth Lotion  Keri Silky Smooth Sensitive Skin Lotion  Nivea Body Creamy Conditioning Oil  Nivea Body Extra Enriched Lotion  Nivea Body Original Lotion  Nivea Body Sheer Moisturizing Lotion Nivea Crme  Nivea Skin Firming Lotion  NutraDerm 30 Skin Lotion  NutraDerm Skin Lotion  NutraDerm Therapeutic Skin Cream  NutraDerm Therapeutic Skin Lotion  ProShield Protective Hand Cream  Provon moisturizing  lotion  Please read over the following fact sheets that you were given.

## 2024-03-19 ENCOUNTER — Inpatient Hospital Stay (HOSPITAL_COMMUNITY)
Admission: RE | Admit: 2024-03-19 | Discharge: 2024-03-19 | Disposition: A | Discharge status: Home / Self Care | Source: Ambulatory Visit

## 2024-03-20 ENCOUNTER — Encounter (HOSPITAL_COMMUNITY): Payer: Self-pay

## 2024-03-20 ENCOUNTER — Other Ambulatory Visit: Payer: Self-pay

## 2024-03-20 ENCOUNTER — Telehealth: Payer: Self-pay | Admitting: Orthopedic Surgery

## 2024-03-20 ENCOUNTER — Telehealth: Payer: Self-pay | Admitting: Cardiology

## 2024-03-20 ENCOUNTER — Encounter (HOSPITAL_COMMUNITY)
Admission: RE | Admit: 2024-03-20 | Discharge: 2024-03-20 | Disposition: A | Discharge status: Home / Self Care | Source: Ambulatory Visit | Attending: Orthopedic Surgery | Admitting: Orthopedic Surgery

## 2024-03-20 VITALS — BP 168/102 | HR 74 | Resp 18 | Ht 71.0 in | Wt 175.0 lb

## 2024-03-20 DIAGNOSIS — I2510 Atherosclerotic heart disease of native coronary artery without angina pectoris: Secondary | ICD-10-CM | POA: Diagnosis not present

## 2024-03-20 DIAGNOSIS — I700 Atherosclerosis of aorta: Secondary | ICD-10-CM | POA: Insufficient documentation

## 2024-03-20 DIAGNOSIS — N433 Hydrocele, unspecified: Secondary | ICD-10-CM | POA: Diagnosis not present

## 2024-03-20 DIAGNOSIS — I351 Nonrheumatic aortic (valve) insufficiency: Secondary | ICD-10-CM | POA: Insufficient documentation

## 2024-03-20 DIAGNOSIS — M4712 Other spondylosis with myelopathy, cervical region: Secondary | ICD-10-CM | POA: Diagnosis not present

## 2024-03-20 DIAGNOSIS — I10 Essential (primary) hypertension: Secondary | ICD-10-CM | POA: Insufficient documentation

## 2024-03-20 DIAGNOSIS — I4519 Other right bundle-branch block: Secondary | ICD-10-CM | POA: Diagnosis not present

## 2024-03-20 DIAGNOSIS — R918 Other nonspecific abnormal finding of lung field: Secondary | ICD-10-CM | POA: Diagnosis not present

## 2024-03-20 DIAGNOSIS — Z01812 Encounter for preprocedural laboratory examination: Secondary | ICD-10-CM | POA: Insufficient documentation

## 2024-03-20 DIAGNOSIS — N400 Enlarged prostate without lower urinary tract symptoms: Secondary | ICD-10-CM | POA: Insufficient documentation

## 2024-03-20 DIAGNOSIS — M12812 Other specific arthropathies, not elsewhere classified, left shoulder: Secondary | ICD-10-CM | POA: Diagnosis not present

## 2024-03-20 DIAGNOSIS — Z01818 Encounter for other preprocedural examination: Secondary | ICD-10-CM

## 2024-03-20 LAB — CBC
HCT: 50.9 % (ref 39.0–52.0)
Hemoglobin: 17.1 g/dL — ABNORMAL HIGH (ref 13.0–17.0)
MCH: 30.8 pg (ref 26.0–34.0)
MCHC: 33.6 g/dL (ref 30.0–36.0)
MCV: 91.5 fL (ref 80.0–100.0)
Platelets: 218 K/uL (ref 150–400)
RBC: 5.56 MIL/uL (ref 4.22–5.81)
RDW: 13.1 % (ref 11.5–15.5)
WBC: 5.5 K/uL (ref 4.0–10.5)
nRBC: 0 % (ref 0.0–0.2)

## 2024-03-20 LAB — SURGICAL PCR SCREEN
MRSA, PCR: POSITIVE — AB
Staphylococcus aureus: POSITIVE — AB

## 2024-03-20 LAB — BASIC METABOLIC PANEL WITH GFR
Anion gap: 8 (ref 5–15)
BUN: 14 mg/dL (ref 8–23)
CO2: 29 mmol/L (ref 22–32)
Calcium: 9.6 mg/dL (ref 8.9–10.3)
Chloride: 105 mmol/L (ref 98–111)
Creatinine, Ser: 0.98 mg/dL (ref 0.61–1.24)
GFR, Estimated: >60 mL/min (ref >60)
Glucose, Bld: 85 mg/dL (ref 70–99)
Potassium: 4.1 mmol/L (ref 3.5–5.1)
Sodium: 142 mmol/L (ref 135–145)

## 2024-03-20 NOTE — Telephone Encounter (Signed)
 Spoke to [place] at the surgeon's office and informed her of the note provided in the encounter regarding need for CTA prior to clearance and she expressed her understanding for the need however the patient was under the impression that he had been cleared.

## 2024-03-20 NOTE — Telephone Encounter (Signed)
 Surgeon's office is requesting clarification as to if the patient is cleared for his procedure on 03/26/24. Patient was seen on 6/11 where the clearance was supposed to had been addressed but not mentioned in the OV notes nor prior official clearance request in the chart. The surgeon's office wants to know if the Angiography is required prior to the procedure as well or if the procedure needs to be cancelled. Please advise

## 2024-03-20 NOTE — Telephone Encounter (Signed)
 Chad Roth's phone number at the surgeon's office [phone]

## 2024-03-20 NOTE — Telephone Encounter (Signed)
 Pts surgical office would like a update.

## 2024-03-20 NOTE — Telephone Encounter (Signed)
 Patient is under the impression he has been cleared by Dr. Edwyna to have Left Reverse Shoulder Arthroplasty on 03-26-24.  Patient stated he contacted cardiology today and they wanted our fax number so they could fax over the clearance.  I did explain to patient we can see the notes in his chart but we will need to clarify his clearance status because the note from Dr Edwyna suggests  Chad Roth was to have a Coronary CTA. There is a notation of one attempt to reach the patient to schedule the CTA however the voicemail was full. I think the patient assumed he was cleared after the echo, but the cardiology note mentions he needs an echo and a coronary CTA.

## 2024-03-20 NOTE — Telephone Encounter (Signed)
 Hello,  Can you all please assist with scheduling the patient's CTA prior to scheduled procedure on 7/24 if possible? Thank you

## 2024-03-20 NOTE — Progress Notes (Signed)
 PCP - denies;states he goes mostly to urgent cares. Says he is trying to make an appointment with a PCP in [place],but he has been busy.  Cardiologist - Referred by Dr. Addie to Dr. Edwyna in June 2025 for preop cardiovascular exam. Pt had echo,EKG and labs completed. Coronary CTA ordered but pt did not complete. Pt given written note at PAT appointment with instructions to call Dr. Posey office(phone number included) and ask for Coronary CT appointment ASAP as his surgery is schedule for Wednesday July 23. Pt stated that he would make the call.   PPM/ICD - denies Device Orders -  Rep Notified -   Chest x-ray - 12/20/23 EKG - 02/12/24 Stress Test - denies ECHO - 02/19/24 Cardiac Cath - denies  Sleep Study - denies CPAP -   Fasting Blood Sugar - na Checks Blood Sugar _____ times a day  Last dose of GLP1 agonist-na   GLP1 instructions:   Blood Thinner Instructions:na Aspirin  Instructions:  hasn't taken for months       ERAS Protcol -clear liquids until 1000 PRE-SURGERY Ensure or G2- no  COVID TEST- na   Anesthesia review:   Patient denies shortness of breath, fever, cough and chest pain at PAT appointment   All instructions explained to the patient, with a verbal understanding of the material. Patient agrees to go over the instructions while at home for a better understanding. Patient also instructed to self quarantine after being tested for COVID-19. The opportunity to ask questions was provided.

## 2024-03-20 NOTE — Telephone Encounter (Signed)
 Pt would like a c/b regarding documentation stating that he is cleared to have surgery on 7/24. Pt was seen on 6/11 as a NP for Clearance. Please advise

## 2024-03-23 ENCOUNTER — Ambulatory Visit: Payer: Self-pay | Admitting: Orthopedic Surgery

## 2024-03-23 ENCOUNTER — Telehealth (HOSPITAL_COMMUNITY): Payer: Self-pay | Admitting: Emergency Medicine

## 2024-03-23 MED ORDER — METOPROLOL TARTRATE 50 MG PO TABS: ORAL_TABLET | ORAL | 0 refills | Status: AC

## 2024-03-23 NOTE — Telephone Encounter (Signed)
 Reaching out to patient to offer assistance regarding upcoming cardiac imaging study; pt verbalizes understanding of appt date/time, parking situation and where to check in, pre-test NPO status and medications ordered, and verified current allergies; name and call back number provided for further questions should they arise Camie Shutter RN Navigator Cardiac Imaging [facility] [phone] office [phone] cell  Metoprolol  tartrate sent to pharm

## 2024-03-23 NOTE — Telephone Encounter (Signed)
 Attempted to call patient regarding upcoming cardiac CT appointment. Left message on voicemail with name and callback number Rockwell Alexandria RN Navigator Cardiac Imaging [facility] [phone] Office [phone] Cell

## 2024-03-23 NOTE — Progress Notes (Signed)
 Voicemail left with Chad Roth, OR scheduler for Dr. Addie, with the following surgical PCR result: +MRSA and +MSSA. Callback number given for any questions.

## 2024-03-23 NOTE — Progress Notes (Signed)
 Hi Debbie.  I think Chad Roth needs to get cardiology study before his surgery is that correct?

## 2024-03-24 ENCOUNTER — Other Ambulatory Visit (HOSPITAL_COMMUNITY): Payer: Self-pay | Admitting: *Deleted

## 2024-03-24 ENCOUNTER — Ambulatory Visit (HOSPITAL_BASED_OUTPATIENT_CLINIC_OR_DEPARTMENT_OTHER)
Admission: RE | Admit: 2024-03-24 | Discharge: 2024-03-24 | Disposition: A | Discharge status: Home / Self Care | Source: Ambulatory Visit | Attending: Cardiology | Admitting: Cardiology

## 2024-03-24 DIAGNOSIS — Z01810 Encounter for preprocedural cardiovascular examination: Secondary | ICD-10-CM

## 2024-03-24 DIAGNOSIS — R0609 Other forms of dyspnea: Secondary | ICD-10-CM

## 2024-03-24 MED ORDER — METOPROLOL TARTRATE 50 MG PO TABS: ORAL_TABLET | ORAL | 0 refills | Status: AC

## 2024-03-24 MED ORDER — NITROGLYCERIN 0.4 MG SL SUBL: 0.8000 mg | SUBLINGUAL_TABLET | Freq: Once | SUBLINGUAL | Status: DC
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
## 2024-03-25 ENCOUNTER — Ambulatory Visit (HOSPITAL_COMMUNITY)
Admission: RE | Admit: 2024-03-25 | Discharge: 2024-03-25 | Disposition: A | Discharge status: Home / Self Care | Source: Ambulatory Visit | Attending: Cardiology | Admitting: Cardiology

## 2024-03-25 DIAGNOSIS — I2510 Atherosclerotic heart disease of native coronary artery without angina pectoris: Secondary | ICD-10-CM | POA: Diagnosis not present

## 2024-03-25 DIAGNOSIS — R0609 Other forms of dyspnea: Secondary | ICD-10-CM | POA: Diagnosis not present

## 2024-03-25 DIAGNOSIS — I2584 Coronary atherosclerosis due to calcified coronary lesion: Secondary | ICD-10-CM | POA: Diagnosis not present

## 2024-03-25 DIAGNOSIS — Z01810 Encounter for preprocedural cardiovascular examination: Secondary | ICD-10-CM | POA: Diagnosis present

## 2024-03-25 MED ORDER — NITROGLYCERIN 0.4 MG SL SUBL
0.8000 mg | SUBLINGUAL_TABLET | Freq: Once | SUBLINGUAL | Status: AC
  Administered 2024-03-25: 0.8 mg via SUBLINGUAL

## 2024-03-25 MED ORDER — IOHEXOL 350 MG/ML SOLN
100.0000 mL | Freq: Once | INTRAVENOUS | Status: AC | PRN
  Administered 2024-03-25: 100 mL via INTRAVENOUS

## 2024-03-25 NOTE — Anesthesia Preprocedure Evaluation (Addendum)
 Anesthesia Evaluation    Airway        Dental   Pulmonary           Cardiovascular   CCTA 03/25/24: RECOMMENDATIONS: CAD-RADS 2: Mild non-obstructive CAD (25-49%). Consider non-atherosclerotic causes of chest pain. Consider preventive therapy and risk factor modification.  Echo 02/19/24: IMPRESSIONS   1. Left ventricular ejection fraction, by estimation, is 65 to 70%. The  left ventricle has normal function. The left ventricle has no regional  wall motion abnormalities. Left ventricular diastolic parameters are  consistent with Grade I diastolic  dysfunction (impaired relaxation).   2. Right ventricular systolic function is normal. The right ventricular  size is normal.   3. Left atrial size was mildly dilated.   4. The mitral valve is normal in structure. Trivial mitral valve  regurgitation. No evidence of mitral stenosis.   5. The aortic valve is tricuspid. There is mild calcification of the  aortic valve. Aortic valve regurgitation is mild. Aortic valve  sclerosis/calcification is present, without any evidence of aortic  stenosis.   6. The inferior vena cava is normal in size with greater than 50%  respiratory variability, suggesting right atrial pressure of 3 mmHg.       Neuro/Psych    GI/Hepatic   Endo/Other    Renal/GU      Musculoskeletal   Abdominal   Peds  Hematology   Anesthesia Other Findings   Reproductive/Obstetrics                              Anesthesia Physical Anesthesia Plan  ASA:   Anesthesia Plan:    Post-op Pain Management:    Induction:   PONV Risk Score and Plan:   Airway Management Planned:   Additional Equipment:   Intra-op Plan:   Post-operative Plan:   Informed Consent:   Plan Discussed with:   Anesthesia Plan Comments: (See PAT note written 03/25/2024 by Narciso Stoutenburg, PA-C.  He had RIGHT elevated hemi-diaphragm on April 2025 CXR.    )         Anesthesia Quick Evaluation

## 2024-03-26 ENCOUNTER — Encounter (HOSPITAL_COMMUNITY): Payer: Self-pay | Admitting: Orthopedic Surgery

## 2024-03-26 ENCOUNTER — Encounter (HOSPITAL_COMMUNITY): Payer: Self-pay | Admitting: Vascular Surgery

## 2024-03-26 ENCOUNTER — Ambulatory Visit (HOSPITAL_COMMUNITY): Admission: RE | Admit: 2024-03-26 | Source: Home / Self Care | Admitting: Orthopedic Surgery

## 2024-03-26 ENCOUNTER — Encounter (HOSPITAL_COMMUNITY): Admission: RE | Payer: Self-pay | Source: Home / Self Care

## 2024-03-26 DIAGNOSIS — Z01818 Encounter for other preprocedural examination: Secondary | ICD-10-CM

## 2024-03-26 SURGERY — ARTHROPLASTY, SHOULDER, TOTAL, REVERSE
Anesthesia: General | Site: Shoulder | Laterality: Left

## 2024-03-26 MED ORDER — LIDOCAINE 2% (20 MG/ML) 5 ML SYRINGE
INTRAMUSCULAR | Status: AC
  Filled 2024-03-26: qty 5

## 2024-03-26 MED ORDER — ROCURONIUM BROMIDE 10 MG/ML (PF) SYRINGE
PREFILLED_SYRINGE | INTRAVENOUS | Status: AC
  Filled 2024-03-26: qty 10

## 2024-03-26 MED ORDER — EPHEDRINE 5 MG/ML INJ
INTRAVENOUS | Status: AC
  Filled 2024-03-26: qty 5

## 2024-03-26 MED ORDER — PHENYLEPHRINE 80 MCG/ML (10ML) SYRINGE FOR IV PUSH (FOR BLOOD PRESSURE SUPPORT)
PREFILLED_SYRINGE | INTRAVENOUS | Status: AC
  Filled 2024-03-26: qty 10

## 2024-03-26 MED ORDER — ONDANSETRON HCL 4 MG/2ML IJ SOLN
INTRAMUSCULAR | Status: AC
Start: 2024-03-26 — End: 2024-03-26
  Filled 2024-03-26: qty 2

## 2024-03-26 MED ORDER — FENTANYL CITRATE (PF) 250 MCG/5ML IJ SOLN
INTRAMUSCULAR | Status: AC
  Filled 2024-03-26: qty 5

## 2024-03-26 MED ORDER — VASOPRESSIN 20 UNIT/ML IV SOLN
INTRAVENOUS | Status: AC
  Filled 2024-03-26: qty 1

## 2024-03-26 MED ORDER — DEXAMETHASONE SODIUM PHOSPHATE 10 MG/ML IJ SOLN
INTRAMUSCULAR | Status: AC
  Filled 2024-03-26: qty 1

## 2024-03-26 MED ORDER — SUGAMMADEX SODIUM 200 MG/2ML IV SOLN
INTRAVENOUS | Status: AC
  Filled 2024-03-26: qty 2

## 2024-04-01 NOTE — Telephone Encounter (Signed)
   Name: Chad Roth  DOB: Feb 03, 1956  MRN: [id number]  Primary Cardiologist: Revankar  Chart reviewed as part of pre-operative protocol coverage. Because of Chad Roth's past medical history and time since last visit, he will require a follow-up in-office visit in order to better assess preoperative cardiovascular risk. He had abnormal cardiac CT and needs discussion and medications.  Pre-op covering staff: - Please schedule appointment and call patient to inform them. If patient already had an upcoming appointment within acceptable timeframe, please add pre-op clearance to the appointment notes so provider is aware. - Please contact requesting surgeon's office via preferred method (i.e, phone, fax) to inform them of need for appointment prior to surgery.   Lamarr Satterfield, NP  04/01/2024, 1:51 PM

## 2024-04-01 NOTE — Telephone Encounter (Signed)
1st attempt to reach pt regarding surgical clearance and the need for an IN OFFICE appointment.  Left pt a detailed message to call back and get that scheduled.

## 2024-04-02 NOTE — Addendum Note (Signed)
 Addended by: ONEITA BERLINER on: 04/02/2024 07:52 AM   Modules accepted: Orders

## 2024-04-02 NOTE — Telephone Encounter (Signed)
-----   Message from Jennifer SAUNDERS Revankar sent at 03/26/2024  8:15 AM EDT ----- Significant but nonobstructive coronary artery disease.  Please bring in patient for Chem-7 liver lipid check.  Patient will need statin therapy.  Copy primary Jennifer SAUNDERS Crape, MD 03/26/2024 8:15 AM  ----- Message ----- From: Rebecka, Rad Results In Sent: 03/25/2024   5:46 PM EDT To: Jennifer SAUNDERS Crape, MD

## 2024-04-02 NOTE — Telephone Encounter (Addendum)
Unable to leave voicemail for patient. 2nd attempt.

## 2024-04-03 ENCOUNTER — Encounter: Payer: Self-pay | Admitting: Orthopedic Surgery

## 2024-04-03 NOTE — Telephone Encounter (Signed)
 3rd attempt: Left message for pt to call our office to schedule IN OFFICE Preop appt.   Will update surgeons office

## 2024-04-05 NOTE — Telephone Encounter (Signed)
 Need clarity on this request.  I do not really understand what this message means.

## 2024-04-08 ENCOUNTER — Encounter: Payer: Self-pay | Admitting: Orthopedic Surgery

## 2024-04-09 ENCOUNTER — Encounter: Admitting: Surgical

## 2024-04-10 NOTE — Telephone Encounter (Signed)
Got it thx

## 2024-04-10 NOTE — Telephone Encounter (Signed)
 Thx debbie

## 2024-05-14 ENCOUNTER — Telehealth: Payer: Self-pay | Admitting: Orthopedic Surgery

## 2024-05-14 NOTE — Telephone Encounter (Signed)
 Pt has cancelled twice for surgery

## 2024-05-14 NOTE — Telephone Encounter (Signed)
 Good morning  Please see the message from your patient.  ----- Message -----  From: Chad Roth  Sent: 03/31/2024   7:51 AM EDT  To: Chad Roth  Subject: Chart Correction Request - Visit Summary        was informed that my surgery cannot proceed until I complete ONE  MORE test. I had a CT scan scheduled for March 25, 2024, at 3 PM. They  required this test to ensure that I don't have high blood pressure  before they would let me have the surgery.    After completing the test, a doctor needed to review the results and  inform the surgical team whether I could move forward with the  surgery. Unfortunately, the doctor did not read the test results on  time, and the surgery team was ready and waiting.     frustrated because no one had updated me about whether I could  proceed with the surgery. Then I received a call from a young lady who  handles the scheduling, and she asked why I wasn't at the [facility]. I  explained that I was told there could be no surgery until the doctor  reviewed the CT scan results. HAS OF TODAY THE TEST HAS NOT BEEN READ.  DROP THE BALL    Despite passing TWO TESTS, THEY WANT ME TO TAKE ONE MORE, AND THAT WAS  THE DAY BEFORE SUGGESTING ALL THE TESTS, THE ANESTHESIA TEAM KEEPS  PUTTING THE BRAKES ON MY SURGERY. I have requested a meeting with the  doctor to discuss this situation.  ----- Message -----  From: Chad Roth  Sent: 03/31/2024   7:49 AM EDT  To: Chad Roth  Subject: Chart Correction Request - Visit Summary        was informed that my surgery cannot proceed until I complete ONE  MORE test. I had a CT scan scheduled for March 25, 2024, at 3 PM. They  required this test to ensure that I don't have high blood pressure  before they would let me have the surgery.    After completing the test, a doctor needed to review the results and  inform the surgical team whether I could move forward with the  surgery. Unfortunately, the doctor did not read the  test results on  time, and the surgery team was ready and waiting.     frustrated because no one had updated me about whether I could  proceed with the surgery. Then I received a call from a young lady who  handles the scheduling, and she asked why I wasn't at the [facility]. I  explained that I was told there could be no surgery until the doctor  reviewed the CT scan results. HAS OF TODAY THE TEST HAS NOT BEEN READ.  DROP THE BALL    Despite passing TWO TESTS, THEY WANT ME TO TAKE ONE MORE, AND THAT WAS  THE DAY BEFORE SUGGESTING ALL THE TESTS, THE ANESTHESIA TEAM KEEPS  PUTTING THE BRAKES ON MY SURGERY. I have requested a meeting with the  doctor to discuss this situation.  his is going to have mne with no job.the custmer i send this to gave me a name of anouther surgent ..because i should have been taken care of by now it apears this doc mudd please lets move forward  ----- Message -----  From: Chad Roth  Sent: 03/29/2024   4:20 PM EDT  To: Chad Roth  Subject: Chart Correction Request -  Visit Summary        Topic: Visit Summary - Chart Correction    Form Title: Chart Correction Request - Visit Summary  Submitted Data  ----------------------------------------    This patient has requested a chart correction to a visit summary.     Visit summary to change: ct test     Visit date: Wed 03/25/2024     Current incorrect information:   who has place all the detail  about suicide this is against my chriten values please remove the .med section metoprolol  tartrate 50 mg   remove both of these     Chart correction requested:   remove both the above meds  Who should be notified:   Chad Roth   Chart Correction Request - Visit Summary (Newest Message First) Chad Roth  P Chad Him Roi1 month ago    thank you .i dont blame any one person .i just want to have this done  asap at the [facility] [place]   Chad Roth. Chad Roth    Chad Roth  Chad Lomax1  month ago   Chad Roth We have forwarded your message to the physician.      Roth, Chad D  You1 month ago   [facility] Good morning  Please see the message from your patient.     Chad Roth  P Chad Him Roi1 month ago    was informed that my surgery cannot proceed until I complete ONE MORE test. I had a CT scan scheduled for March 25, 2024, at 3 PM. They required this test to ensure that I don't have high blood pressure before they would let me have the surgery.   After completing the test, a doctor needed to review the results and inform the surgical team whether I could move forward with the surgery. Unfortunately, the doctor did not read the test results on time, and the surgery team was ready and waiting.    frustrated because no one had updated me about whether I could proceed with the surgery. Then I received a call from a young lady who handles the scheduling, and she asked why I wasn't at the [facility]. I explained that I was told there could be no surgery until the doctor reviewed the CT scan results. HAS OF TODAY THE TEST HAS NOT BEEN READ. DROP THE BALL   Despite passing TWO TESTS, THEY WANT ME TO TAKE ONE MORE, AND THAT WAS THE DAY BEFORE SUGGESTING ALL THE TESTS, THE ANESTHESIA TEAM KEEPS PUTTING THE BRAKES ON MY SURGERY. I have requested a meeting with the doctor to discuss this situation.    Chad Roth  P Chad Him Roi1 month ago    was informed that my surgery cannot proceed until I complete ONE MORE test. I had a CT scan scheduled for March 25, 2024, at 3 PM. They required this test to ensure that I don't have high blood pressure before they would let me have the surgery.   After completing the test, a doctor needed to review the results and inform the surgical team whether I could move forward with the surgery. Unfortunately, the doctor did not read the test results on time, and the surgery team was ready and waiting.    frustrated because no one had updated  me about whether I could proceed with the surgery. Then I received a call from a young lady who handles the scheduling, and she asked why I wasn't at the [facility]. I explained that  I was told there could be no surgery until the doctor reviewed the CT scan results. HAS OF TODAY THE TEST HAS NOT BEEN READ. DROP THE BALL   Despite passing TWO TESTS, THEY WANT ME TO TAKE ONE MORE, AND THAT WAS THE DAY BEFORE SUGGESTING ALL THE TESTS, THE ANESTHESIA TEAM KEEPS PUTTING THE BRAKES ON MY SURGERY. I have requested a meeting with the doctor to discuss this situation. his is going to have mne with no job.the custmer i send this to gave me a name of anouther surgent ..because i should have been taken care of by now it apears this doc mudd please lets move forward    Chad Goldberger  P Chad Him Roi1 month ago    Topic: Visit Summary - Chart Correction   Form Title: Chart Correction Request - Visit Summary Submitted Data ----------------------------------------   This patient has requested a chart correction to a visit summary.    Visit summary to change: ct test   Visit date: Wed 03/25/2024   Current incorrect information:   who has place all the detail  about suicide this is against my chriten values please remove the .med section metoprolol  tartrate 50 mg   remove both of these   Chart correction requested:   remove both the above meds Who should be notified:   Uchechukwu Fosberg

## 2024-07-06 ENCOUNTER — Encounter: Payer: Self-pay | Admitting: Radiology

## 2024-08-01 ENCOUNTER — Encounter: Payer: Self-pay | Admitting: Orthopedic Surgery

## 2024-08-18 NOTE — Telephone Encounter (Signed)
 Hi [name].  Last note from cardiology was 04/01/2024.  They wanted an in office appointment to assess cardiovascular risk.  Can you call Chad Roth and tell him that that is what we need in order to proceed with scheduling his surgery.  Thanks

## 2024-10-07 ENCOUNTER — Encounter: Payer: Self-pay | Admitting: Orthopedic Surgery
